# Patient Record
Sex: Male | Born: 1965 | Hispanic: No | Marital: Single | Smoking: Current every day smoker
Health system: Southern US, Community
[De-identification: ages and names within clinical notes are randomized; demographics above are authoritative.]

## PROBLEM LIST (undated history)

## (undated) DIAGNOSIS — Z789 Other specified health status: Secondary | ICD-10-CM

## (undated) HISTORY — PX: APPENDECTOMY: SHX54

---

## 1999-09-20 ENCOUNTER — Emergency Department (HOSPITAL_COMMUNITY): Admission: EM | Admit: 1999-09-20 | Discharge: 1999-09-20 | Payer: Self-pay | Admitting: Emergency Medicine

## 2017-10-21 ENCOUNTER — Emergency Department (HOSPITAL_BASED_OUTPATIENT_CLINIC_OR_DEPARTMENT_OTHER)
Admission: EM | Admit: 2017-10-21 | Discharge: 2017-10-21 | Disposition: A | Discharge status: Home / Self Care | Payer: Self-pay | Attending: Emergency Medicine | Admitting: Emergency Medicine

## 2017-10-21 ENCOUNTER — Other Ambulatory Visit: Payer: Self-pay

## 2017-10-21 ENCOUNTER — Emergency Department (HOSPITAL_BASED_OUTPATIENT_CLINIC_OR_DEPARTMENT_OTHER): Payer: Self-pay

## 2017-10-21 ENCOUNTER — Encounter (HOSPITAL_BASED_OUTPATIENT_CLINIC_OR_DEPARTMENT_OTHER): Payer: Self-pay | Admitting: *Deleted

## 2017-10-21 DIAGNOSIS — M79671 Pain in right foot: Secondary | ICD-10-CM | POA: Insufficient documentation

## 2017-10-21 DIAGNOSIS — F17210 Nicotine dependence, cigarettes, uncomplicated: Secondary | ICD-10-CM | POA: Insufficient documentation

## 2017-10-21 DIAGNOSIS — M545 Low back pain: Secondary | ICD-10-CM | POA: Insufficient documentation

## 2017-10-21 DIAGNOSIS — W11XXXA Fall on and from ladder, initial encounter: Secondary | ICD-10-CM | POA: Insufficient documentation

## 2017-10-21 DIAGNOSIS — M79672 Pain in left foot: Secondary | ICD-10-CM | POA: Insufficient documentation

## 2017-10-21 NOTE — ED Provider Notes (Signed)
[facility] EMERGENCY DEPARTMENT Provider Note   CSN: [id number] Arrival date & time: 10/21/17  1047     History   Chief Complaint Chief Complaint  Patient presents with  . Fall  . Back Pain    HPI Eugene Marquez is a 52 y.o. male.  HPI   Patient is a 52 year old male with no significant past medical history who presents to the emergency department for evaluation of lateral foot pain after fall yesterday.  Patient reports that he was standing on a 12 foot ladder which was accidentally bumped by a coworker.  He fell, landing on straight up on his feet.  Denies falling over or hitting his head.  Denies loss of consciousness.  Reports he now has bilateral heel pain, left worse than right.  States the pain is about a 7/10 in severity and feels sore.  Pain is worsened with weightbearing.  He tried taking some ibuprofen yesterday without improvement.  Is also reporting mild lower back pain which is present only with twisting or bending.  Denies hip or knee pain.  He denies headache, numbness, weakness, visual disturbance, chest pain, shortness of breath, abdominal pain, open wounds.  Is able to able to independently despite pain.  History reviewed. No pertinent past medical history.  There are no active problems to display for this patient.   History reviewed. No pertinent surgical history.      Home Medications    Prior to Admission medications   Not on File    Family History History reviewed. No pertinent family history.  Social History Social History   Tobacco Use  . Smoking status: Current Every Day Smoker  . Smokeless tobacco: Never Used  Substance Use Topics  . Alcohol use: Not on file  . Drug use: Not on file     Allergies   Patient has no known allergies.   Review of Systems Review of Systems  Constitutional: Negative for fever.  Eyes: Negative for visual disturbance.  Respiratory: Negative for shortness of breath.   Cardiovascular:  Negative for chest pain.  Gastrointestinal: Negative for abdominal pain, nausea and vomiting.  Genitourinary: Negative for difficulty urinating.  Musculoskeletal: Positive for arthralgias (bilateral foot pain) and back pain. Negative for neck pain.  Skin: Negative for wound.  Neurological: Negative for weakness, numbness and headaches.  Psychiatric/Behavioral: Negative for agitation.     Physical Exam Updated Vital Signs BP 125/88 (BP Location: Right Arm)   Pulse 80   Resp 18   SpO2 100%   Physical Exam  Constitutional: He is oriented to person, place, and time. He appears well-developed and well-nourished. No distress.  No acute distress, nontoxic-appearing.  HENT:  Head: Normocephalic and atraumatic.  Mouth/Throat: Oropharynx is clear and moist. No oropharyngeal exudate.  Eyes: Pupils are equal, round, and reactive to light. Conjunctivae are normal. Right eye exhibits no discharge. Left eye exhibits no discharge.  Neck: Normal range of motion. Neck supple.  Approximately 5 cm mass noted over the posterior neck.  It is freely mobile.  No erythema, warmth overlying.  It is nontender to palpation.  No midline cervical spine tenderness.  Cardiovascular: Normal rate, regular rhythm and intact distal pulses.  Pulmonary/Chest: Effort normal and breath sounds normal. No respiratory distress. He has no wheezes.  Abdominal: Soft. There is no tenderness.  Musculoskeletal:  No midline T-spine or L-spine tenderness.  No tenderness over the bilateral paraspinal muscles of the thoracic or lumbar spine.  Left and right foot with tenderness over the  heel.  No erythema, warmth, break in skin or ecchymosis noted.  No deformity over the foot.  Full active range of motion of the ankle without pain.  No tenderness over the navicular or base of the fifth metatarsal.  Achilles intact bilaterally.  DP pulses 2+ bilaterally.  Sensation to light touch intact in toes of bilateral feet.  Neurological: He is  alert and oriented to person, place, and time. Coordination normal.  Skin: Skin is warm and dry. Capillary refill takes less than 2 seconds. He is not diaphoretic.  Psychiatric: He has a normal mood and affect. His behavior is normal.  Nursing note and vitals reviewed.    ED Treatments / Results  Labs (all labs ordered are listed, but only abnormal results are displayed) Labs Reviewed - No data to display  EKG None  Radiology Dg Foot Complete Left  Result Date: 10/21/2017 CLINICAL DATA:  Left foot pain. The patient suffered a fall from a ladder today. Initial encounter. EXAM: LEFT FOOT - COMPLETE 3+ VIEW COMPARISON:  None. FINDINGS: There is no evidence of fracture or dislocation. 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 Soft tissues are unremarkable. IMPRESSION: No acute abnormality. Mild moderate first MTP osteoarthritis Electronically Signed   By: Drusilla Kanner M.D.   On: 10/21/2017 13:14   Dg Foot Complete Right  Result Date: 10/21/2017 CLINICAL DATA:  Status post fall from a ladder today with a foot injury. Pain. Initial encounter. EXAM: RIGHT FOOT COMPLETE - 3+ VIEW COMPARISON:  None. FINDINGS: There is no evidence of fracture or dislocation. There is no evidence of arthropathy or other focal bone abnormality. Soft tissues are unremarkable. IMPRESSION: Negative exam. Electronically Signed   By: Drusilla Kanner M.D.   On: 10/21/2017 13:15    Procedures Procedures (including critical care time)  Medications Ordered in ED Medications - No data to display   Initial Impression / Assessment and Plan / ED Course  I have reviewed the triage vital signs and the nursing notes.  Pertinent labs & imaging results that were available during my care of the patient were reviewed by me and considered in my medical decision making (see chart for details).     Patient presents after falling from a 12 foot ladder yesterday on his feet.  Denies hitting his head or loss of  consciousness.  Reporting bilateral foot pain.  Also endorsing some back pain with movement.  No midline T-spine or L-spine tenderness on exam and patient reports back pain only occurs with movement.  He is neurovascularly intact in bilateral lower extremities.  Given no pain on exam, doubt acute vertebral fracture.  No concern for spinal injury.  Do not think that x-rays of the spine are indicated given exam and discussed this with patient who agrees.  X-ray right and left foot without acute fracture or abnormality. Foot is neurovascularly intact. No break in skin. Counseled patient on symptomatic management with NSAIDS, tylenol and RICE protocol. Discussed reasons to return to the Emergency Department. Patient agrees and voices understanding to the above plan and has no complaints prior to discharge.   Final Clinical Impressions(s) / ED Diagnoses   Final diagnoses:  Pain in both feet  Fall from ladder, initial encounter    ED Discharge Orders    None       Kellie Shropshire, Cordelia Poche 10/25/17 1459    Tegeler, Canary Brim, MD 10/29/17 [phone]

## 2017-10-21 NOTE — Discharge Instructions (Addendum)
X-rays are reassuring.  No broken bones.  Please follow-up with a regular doctor to establish care.  I have listed the information to [facility] below.  They are located across the street for [facility] and accept patients who do not have insurance.  You can take Tylenol and ibuprofen for pain.  Return to the emergency department if you have any new or concerning symptoms.

## 2017-10-21 NOTE — ED Triage Notes (Signed)
Pt reports another worker hit his ladder while he was working on the eaves of a home yesterday and he fell approx 12' from ladder. Pt amb to triage with quick steady gait, smiling and laughing during triage, denies hitting head or loc, states he landed on his feet and also has bilateral heel pain.

## 2018-10-20 IMAGING — CR DG FOOT COMPLETE 3+V*R*
3 series · 3 of 3 positions shown · non-contrast
Comparison: None.

CLINICAL DATA: Status post fall from a ladder today with a foot
injury. Pain. Initial encounter.

EXAM:
RIGHT FOOT COMPLETE - 3+ VIEW

[t foot ap right]
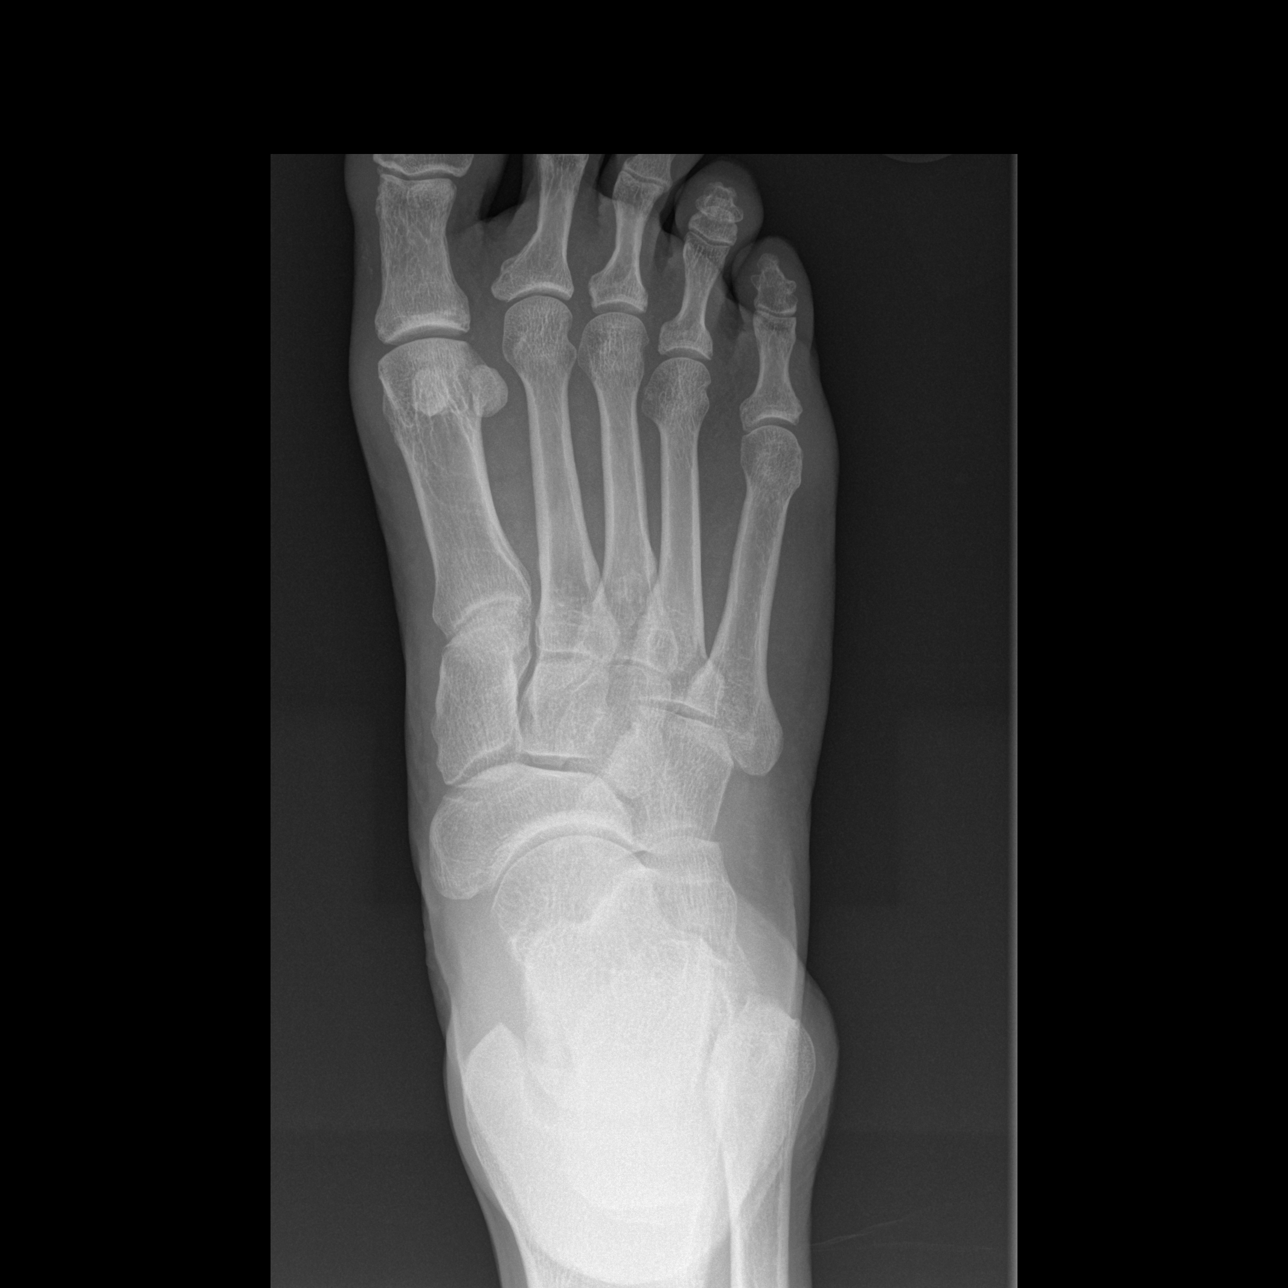

[t foot oblique right]
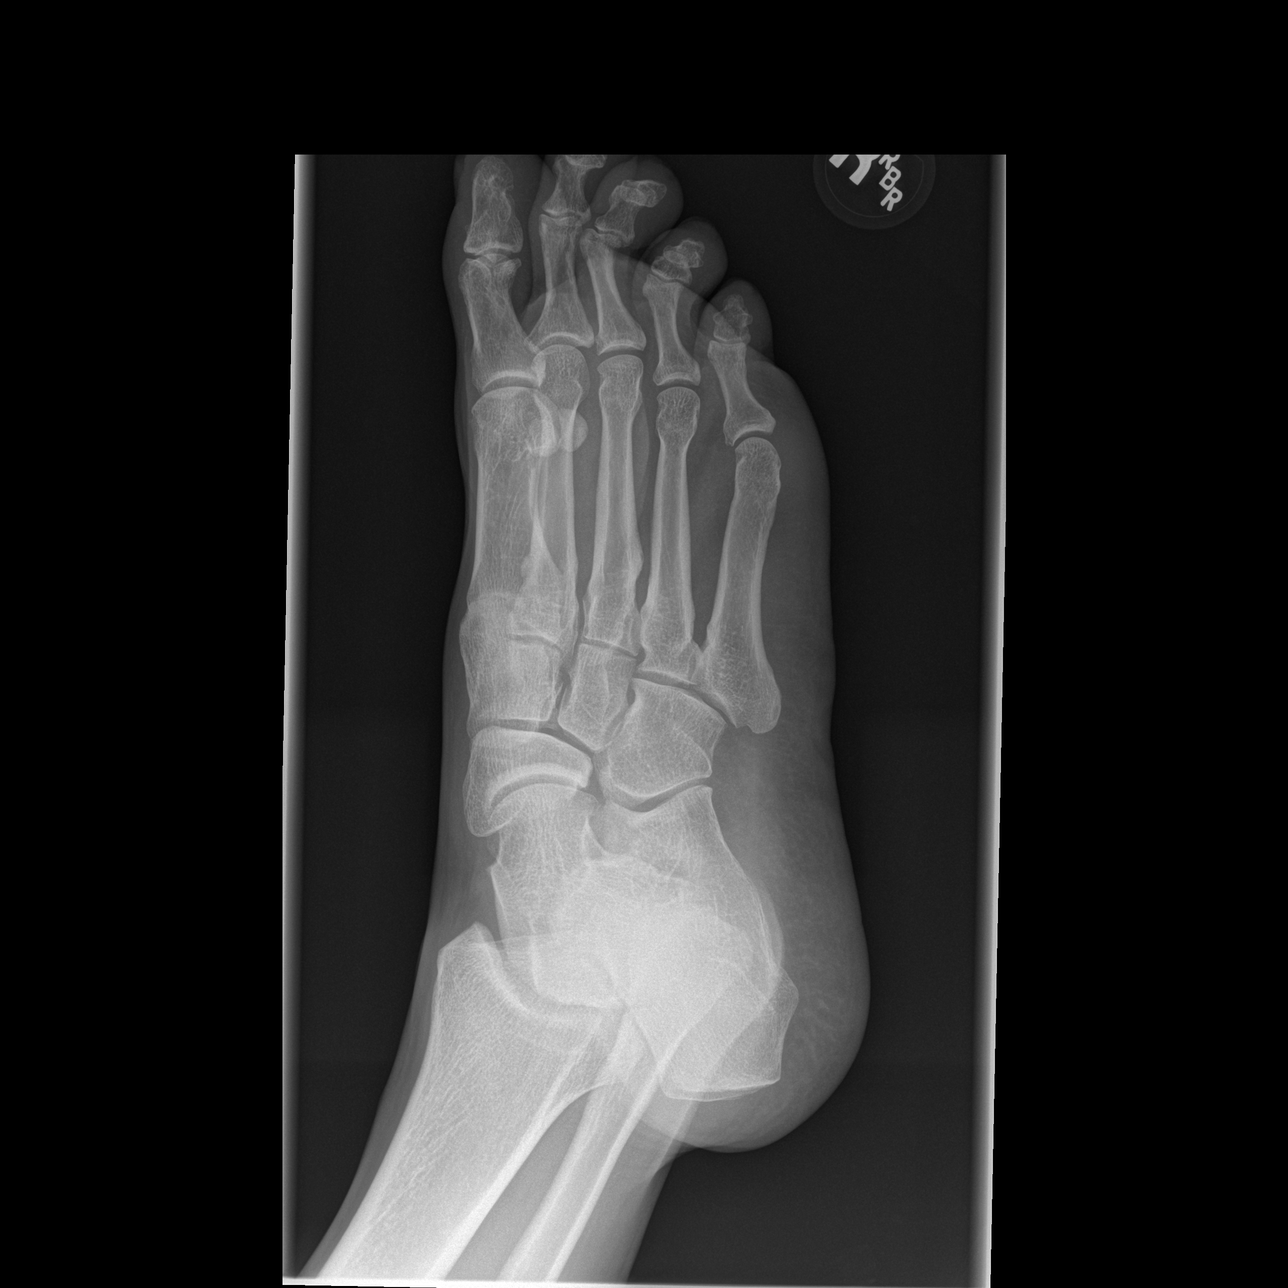

[t foot lat right]
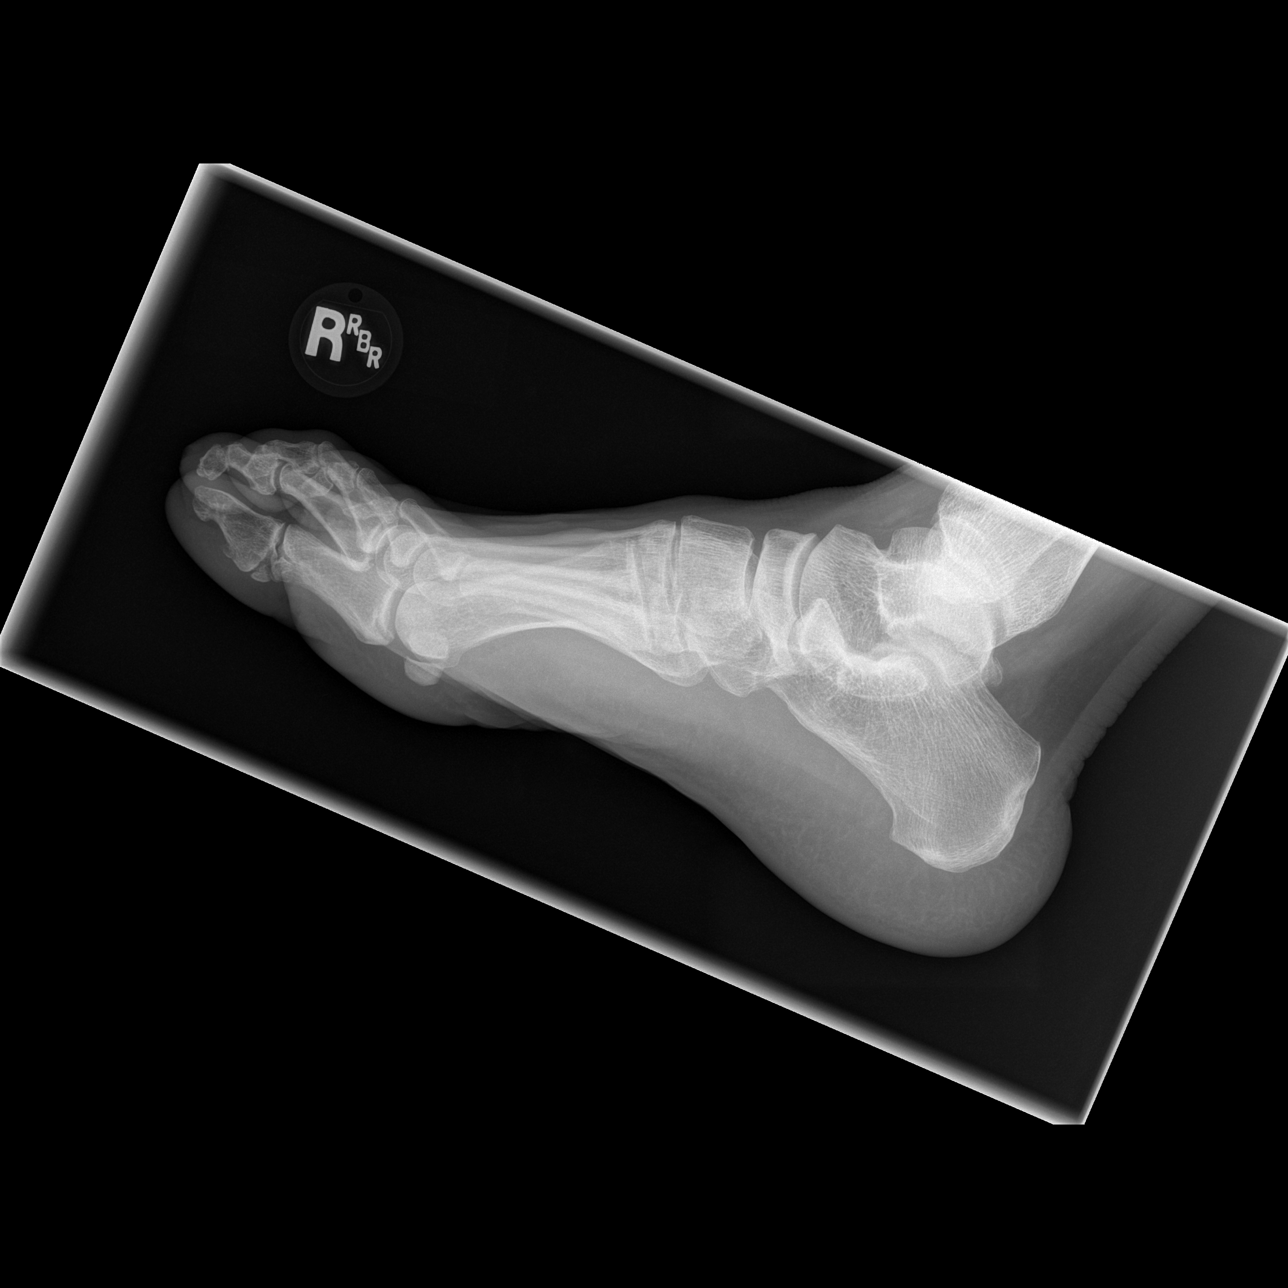

[3 of 3 positions shown; findings below may reference images not displayed]

FINDINGS: There is no evidence of fracture or dislocation. There is no
evidence of arthropathy or other focal bone abnormality. Soft
tissues are unremarkable.
IMPRESSION: Negative exam.

## 2018-10-20 IMAGING — CR DG FOOT COMPLETE 3+V*L*
3 series · 3 of 3 positions shown · non-contrast
Comparison: None.

CLINICAL DATA: Left foot pain. The patient suffered a fall from a
ladder today. Initial encounter.

EXAM:
LEFT FOOT - COMPLETE 3+ VIEW

[t foot ap left]
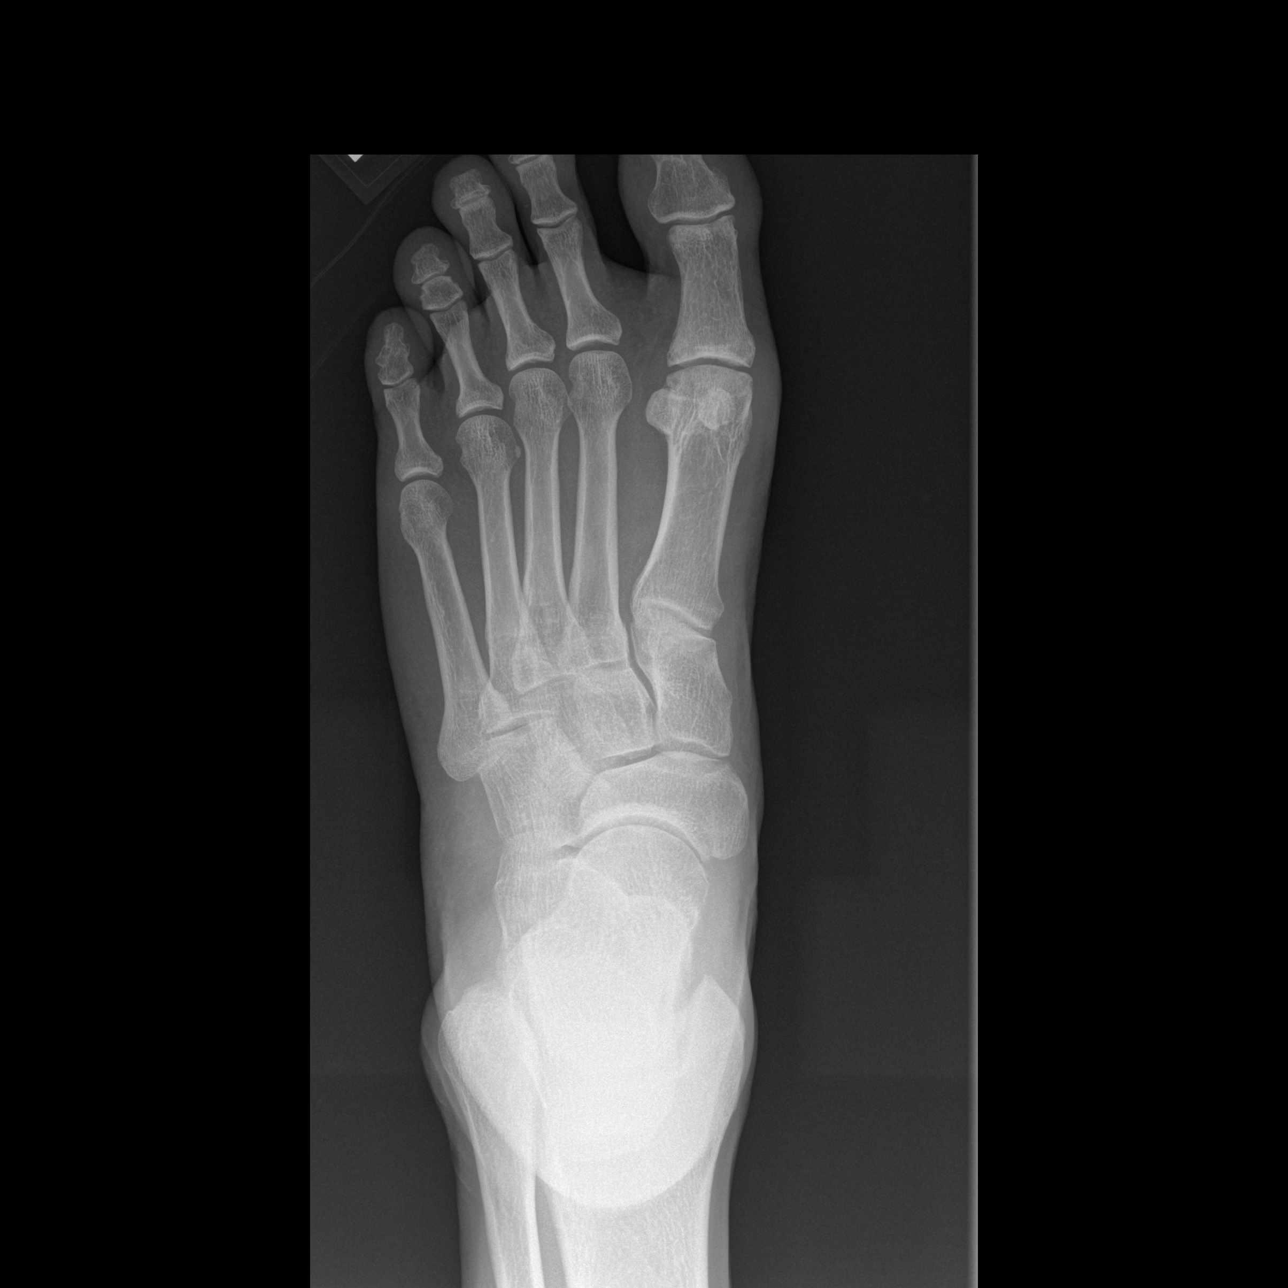

[t foot oblique left]
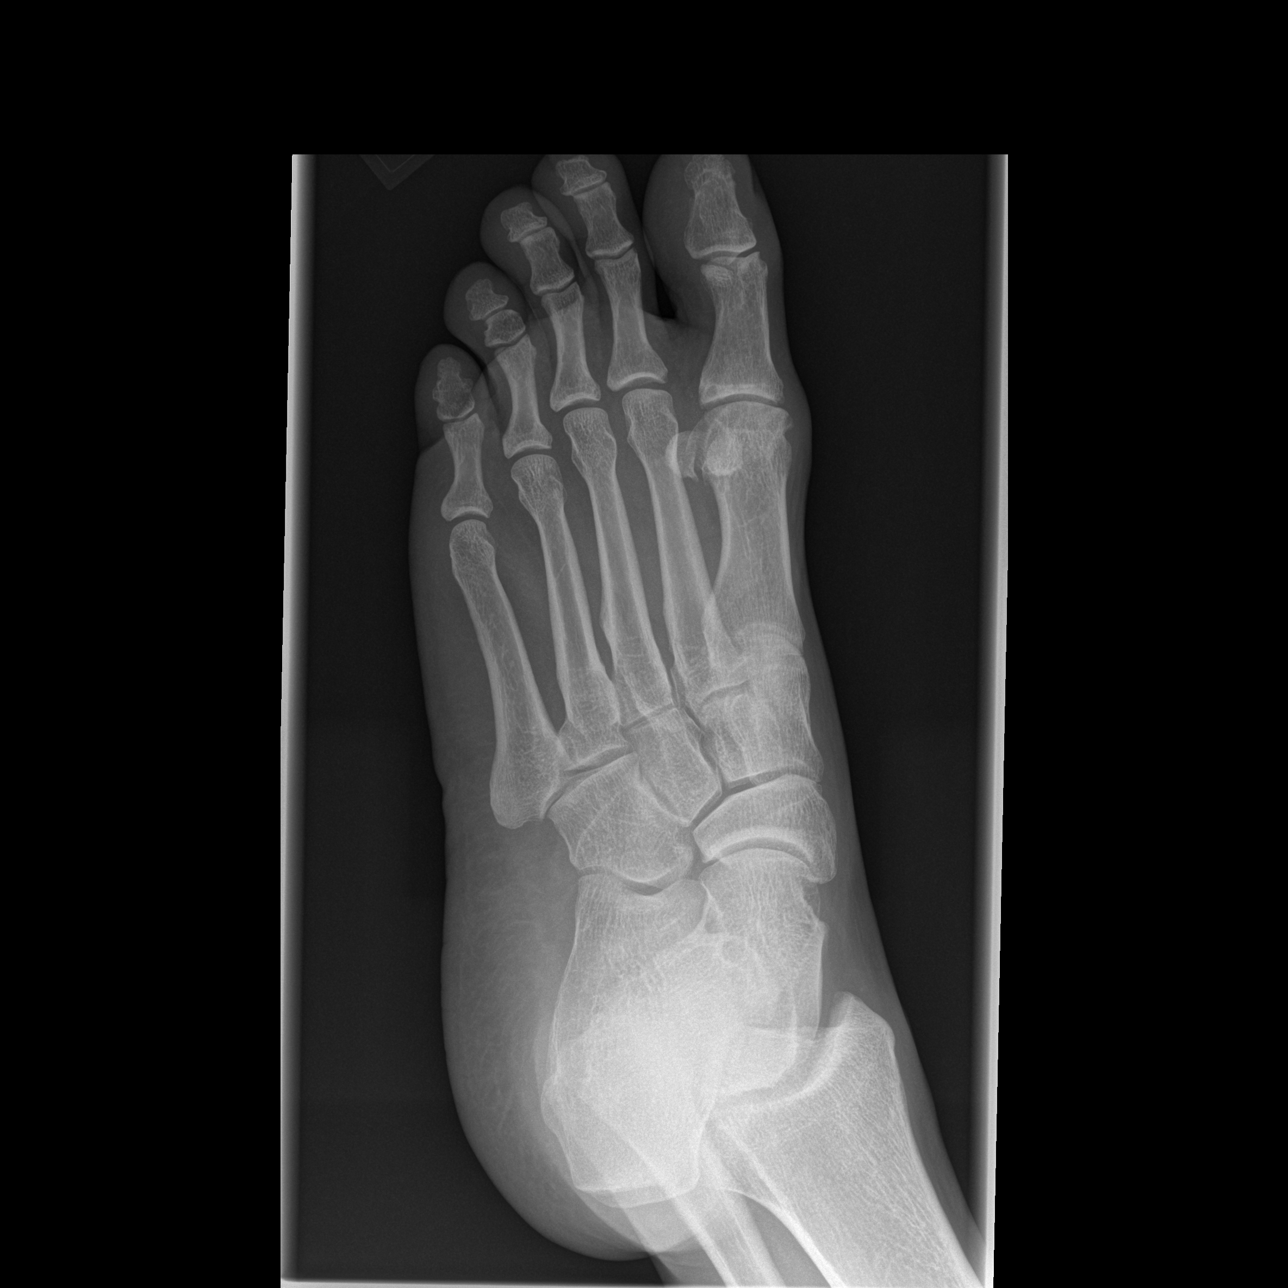

[t foot lat left]
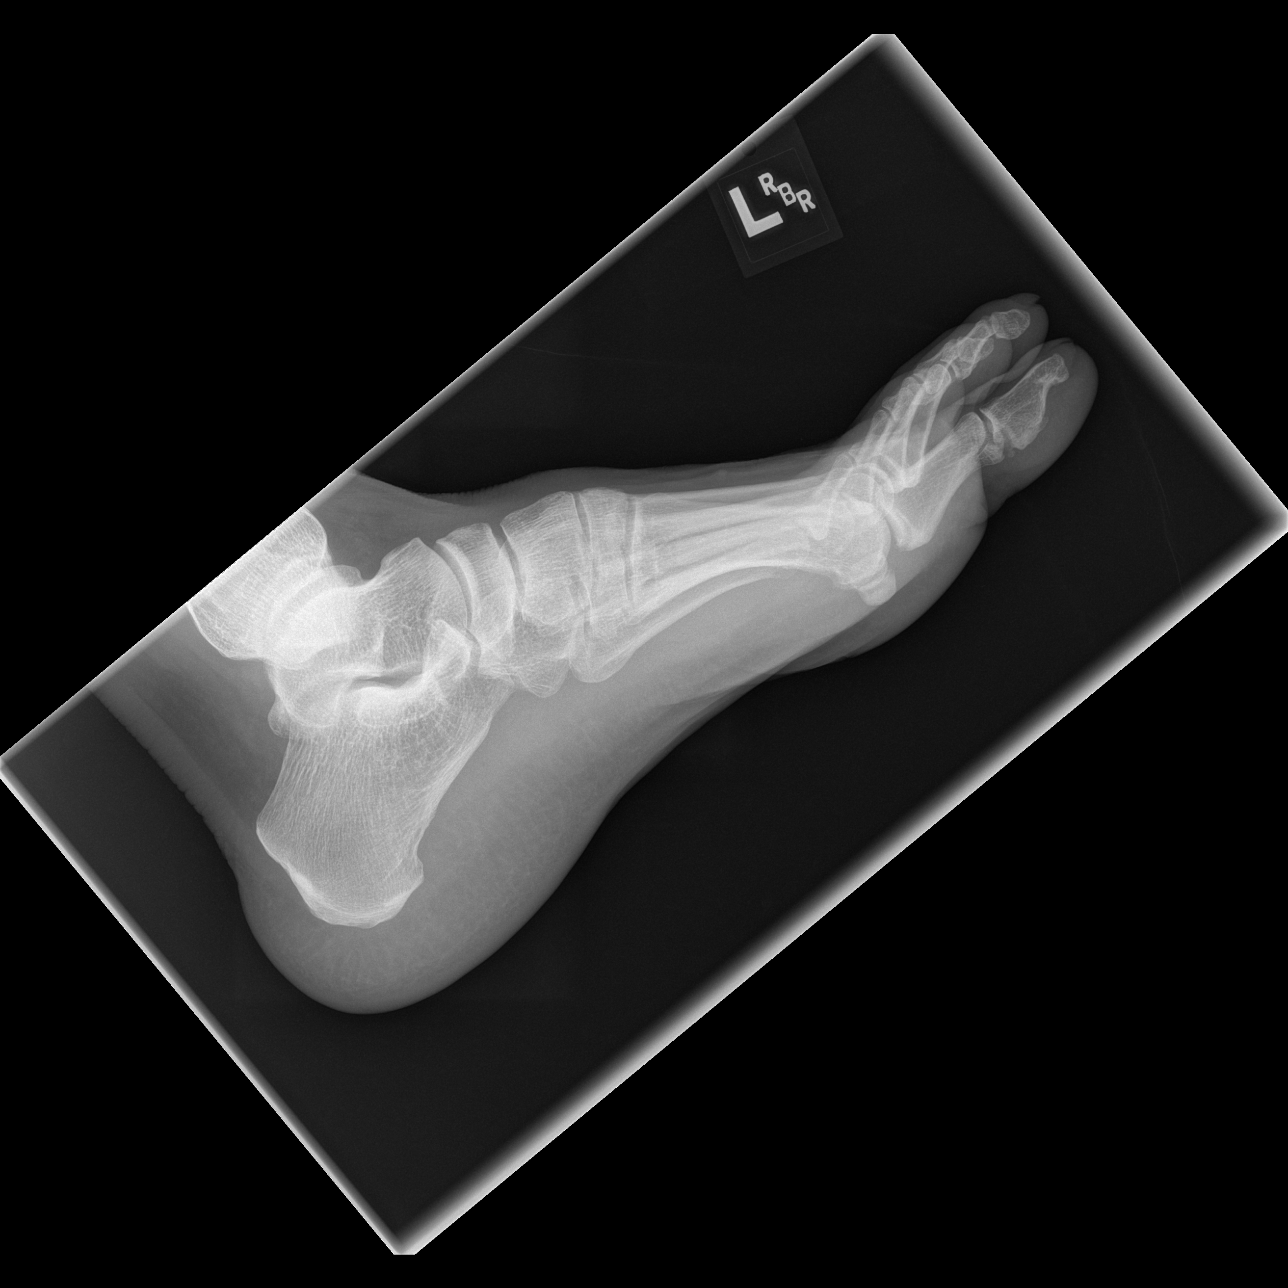

[3 of 3 positions shown; findings below may reference images not displayed]

FINDINGS: There is no evidence of fracture or dislocation. Mild to moderate
first MTP osteoarthritis is noted. Soft tissues are unremarkable.
IMPRESSION: No acute abnormality.

Mild moderate first MTP osteoarthritis

## 2024-01-23 ENCOUNTER — Encounter (HOSPITAL_COMMUNITY): Payer: Self-pay

## 2024-01-23 ENCOUNTER — Ambulatory Visit (HOSPITAL_COMMUNITY)
Admission: EM | Admit: 2024-01-23 | Discharge: 2024-01-23 | Disposition: A | Discharge status: Home / Self Care | Payer: Self-pay | Attending: Orthopedic Surgery | Admitting: Orthopedic Surgery

## 2024-01-23 ENCOUNTER — Other Ambulatory Visit: Payer: Self-pay

## 2024-01-23 ENCOUNTER — Emergency Department (HOSPITAL_COMMUNITY): Payer: Self-pay

## 2024-01-23 ENCOUNTER — Emergency Department (HOSPITAL_COMMUNITY): Payer: Self-pay | Admitting: Anesthesiology

## 2024-01-23 ENCOUNTER — Encounter (HOSPITAL_COMMUNITY)
Admission: EM | Disposition: A | Discharge status: Home / Self Care | Payer: Self-pay | Source: Home / Self Care | Attending: Emergency Medicine

## 2024-01-23 DIAGNOSIS — F17200 Nicotine dependence, unspecified, uncomplicated: Secondary | ICD-10-CM | POA: Insufficient documentation

## 2024-01-23 DIAGNOSIS — S92901B Unspecified fracture of right foot, initial encounter for open fracture: Secondary | ICD-10-CM

## 2024-01-23 DIAGNOSIS — S92511B Displaced fracture of proximal phalanx of right lesser toe(s), initial encounter for open fracture: Secondary | ICD-10-CM | POA: Insufficient documentation

## 2024-01-23 DIAGNOSIS — S91121A Laceration with foreign body of right great toe without damage to nail, initial encounter: Secondary | ICD-10-CM | POA: Insufficient documentation

## 2024-01-23 DIAGNOSIS — S91311A Laceration without foreign body, right foot, initial encounter: Secondary | ICD-10-CM

## 2024-01-23 DIAGNOSIS — W3189XA Contact with other specified machinery, initial encounter: Secondary | ICD-10-CM | POA: Insufficient documentation

## 2024-01-23 HISTORY — PX: INCISION AND DRAINAGE OF WOUND: SHX1803

## 2024-01-23 LAB — COMPREHENSIVE METABOLIC PANEL WITH GFR
ALT: 20 U/L (ref 0–44)
AST: 22 U/L (ref 15–41)
Albumin: 3.6 g/dL (ref 3.5–5.0)
Alkaline Phosphatase: 70 U/L (ref 38–126)
Anion gap: 11 (ref 5–15)
BUN: 10 mg/dL (ref 6–20)
CO2: 22 mmol/L (ref 22–32)
Calcium: 8.5 mg/dL — ABNORMAL LOW (ref 8.9–10.3)
Chloride: 108 mmol/L (ref 98–111)
Creatinine, Ser: 0.86 mg/dL (ref 0.61–1.24)
GFR, Estimated: >60 mL/min (ref >60)
Glucose, Bld: 108 mg/dL — ABNORMAL HIGH (ref 70–99)
Potassium: 3.2 mmol/L — ABNORMAL LOW (ref 3.5–5.1)
Sodium: 141 mmol/L (ref 135–145)
Total Bilirubin: 0.5 mg/dL (ref 0.0–1.2)
Total Protein: 6.4 g/dL — ABNORMAL LOW (ref 6.5–8.1)

## 2024-01-23 LAB — CBC WITH DIFFERENTIAL/PLATELET
Abs Immature Granulocytes: 0.01 K/uL (ref 0.00–0.07)
Basophils Absolute: 0.1 K/uL (ref 0.0–0.1)
Basophils Relative: 1 %
Eosinophils Absolute: 0.1 K/uL (ref 0.0–0.5)
Eosinophils Relative: 3 %
HCT: 41.7 % (ref 39.0–52.0)
Hemoglobin: 14.1 g/dL (ref 13.0–17.0)
Immature Granulocytes: 0 %
Lymphocytes Relative: 40 %
Lymphs Abs: 1.9 K/uL (ref 0.7–4.0)
MCH: 30.4 pg (ref 26.0–34.0)
MCHC: 33.8 g/dL (ref 30.0–36.0)
MCV: 89.9 fL (ref 80.0–100.0)
Monocytes Absolute: 0.4 K/uL (ref 0.1–1.0)
Monocytes Relative: 9 %
Neutro Abs: 2.3 K/uL (ref 1.7–7.7)
Neutrophils Relative %: 47 %
Platelets: 253 K/uL (ref 150–400)
RBC: 4.64 MIL/uL (ref 4.22–5.81)
RDW: 13 % (ref 11.5–15.5)
WBC: 4.9 K/uL (ref 4.0–10.5)
nRBC: 0 % (ref 0.0–0.2)

## 2024-01-23 LAB — ABO/RH: ABO/RH(D): O POS

## 2024-01-23 LAB — TYPE AND SCREEN
ABO/RH(D): O POS
Antibody Screen: NEGATIVE

## 2024-01-23 SURGERY — IRRIGATION AND DEBRIDEMENT WOUND
Anesthesia: General | Site: Foot | Laterality: Right

## 2024-01-23 MED ORDER — ACETAMINOPHEN 325 MG PO TABS: 325.0000 mg | ORAL_TABLET | Freq: Four times a day (QID) | ORAL | Status: DC | PRN

## 2024-01-23 MED ORDER — LIDOCAINE 2% (20 MG/ML) 5 ML SYRINGE
INTRAMUSCULAR | Status: DC | PRN
  Administered 2024-01-23: 60 mg via INTRAVENOUS

## 2024-01-23 MED ORDER — 0.9 % SODIUM CHLORIDE (POUR BTL) OPTIME
TOPICAL | Status: DC | PRN
Start: 2024-01-23 — End: 2024-01-23
  Administered 2024-01-23: 1000 mL

## 2024-01-23 MED ORDER — OXYCODONE HCL 5 MG PO TABS
ORAL_TABLET | ORAL | Status: AC
  Filled 2024-01-23: qty 1

## 2024-01-23 MED ORDER — PROPOFOL 10 MG/ML IV BOLUS
INTRAVENOUS | Status: AC
  Filled 2024-01-23: qty 20

## 2024-01-23 MED ORDER — DOCUSATE SODIUM 100 MG PO CAPS
100.0000 mg | ORAL_CAPSULE | Freq: Two times a day (BID) | ORAL | Status: DC
Start: 2024-01-23 — End: 2024-01-23

## 2024-01-23 MED ORDER — OXYCODONE HCL 5 MG PO TABS
5.0000 mg | ORAL_TABLET | ORAL | Status: DC | PRN
  Administered 2024-01-23: 5 mg via ORAL

## 2024-01-23 MED ORDER — LACTATED RINGERS IV SOLN: INTRAVENOUS | Status: DC | PRN

## 2024-01-23 MED ORDER — ONDANSETRON HCL 4 MG PO TABS
4.0000 mg | ORAL_TABLET | Freq: Four times a day (QID) | ORAL | Status: DC | PRN
Start: 2024-01-23 — End: 2024-01-23

## 2024-01-23 MED ORDER — FENTANYL CITRATE (PF) 250 MCG/5ML IJ SOLN
INTRAMUSCULAR | Status: DC | PRN
  Administered 2024-01-23: 100 ug via INTRAVENOUS

## 2024-01-23 MED ORDER — VASHE WOUND IRRIGATION OPTIME
TOPICAL | Status: DC | PRN
  Administered 2024-01-23: 34 [oz_av]

## 2024-01-23 MED ORDER — SUGAMMADEX SODIUM 200 MG/2ML IV SOLN
INTRAVENOUS | Status: DC | PRN
  Administered 2024-01-23: 200 mg via INTRAVENOUS

## 2024-01-23 MED ORDER — ROCURONIUM BROMIDE 10 MG/ML (PF) SYRINGE
PREFILLED_SYRINGE | INTRAVENOUS | Status: DC | PRN
  Administered 2024-01-23: 30 mg via INTRAVENOUS

## 2024-01-23 MED ORDER — DEXAMETHASONE SODIUM PHOSPHATE 10 MG/ML IJ SOLN
INTRAMUSCULAR | Status: DC | PRN
  Administered 2024-01-23: 10 mg via INTRAVENOUS

## 2024-01-23 MED ORDER — PHENYLEPHRINE 80 MCG/ML (10ML) SYRINGE FOR IV PUSH (FOR BLOOD PRESSURE SUPPORT)
PREFILLED_SYRINGE | INTRAVENOUS | Status: DC | PRN
  Administered 2024-01-23 (×3): 160 ug via INTRAVENOUS

## 2024-01-23 MED ORDER — METHOCARBAMOL 500 MG PO TABS
500.0000 mg | ORAL_TABLET | Freq: Four times a day (QID) | ORAL | Status: DC | PRN
Start: 2024-01-23 — End: 2024-01-23
  Administered 2024-01-23: 500 mg via ORAL

## 2024-01-23 MED ORDER — ACETAMINOPHEN 500 MG PO TABS
ORAL_TABLET | ORAL | Status: AC
  Filled 2024-01-23: qty 2

## 2024-01-23 MED ORDER — FENTANYL CITRATE (PF) 250 MCG/5ML IJ SOLN
INTRAMUSCULAR | Status: AC
  Filled 2024-01-23: qty 5

## 2024-01-23 MED ORDER — PROPOFOL 10 MG/ML IV BOLUS
INTRAVENOUS | Status: DC | PRN
  Administered 2024-01-23: 200 mg via INTRAVENOUS

## 2024-01-23 MED ORDER — ONDANSETRON HCL 4 MG/2ML IJ SOLN
INTRAMUSCULAR | Status: DC | PRN
  Administered 2024-01-23: 4 mg via INTRAVENOUS

## 2024-01-23 MED ORDER — OXYCODONE HCL 5 MG PO TABS: 5.0000 mg | ORAL_TABLET | ORAL | 0 refills | Status: AC | PRN

## 2024-01-23 MED ORDER — HYDROMORPHONE HCL 1 MG/ML IJ SOLN: 0.5000 mg | INTRAMUSCULAR | Status: DC | PRN

## 2024-01-23 MED ORDER — ACETAMINOPHEN 500 MG PO TABS
1000.0000 mg | ORAL_TABLET | Freq: Four times a day (QID) | ORAL | Status: DC
Start: 2024-01-23 — End: 2024-01-23
  Administered 2024-01-23: 1000 mg via ORAL

## 2024-01-23 MED ORDER — TETANUS-DIPHTH-ACELL PERTUSSIS 5-2.5-18.5 LF-MCG/0.5 IM SUSY: 0.5000 mL | PREFILLED_SYRINGE | Freq: Once | INTRAMUSCULAR | Status: DC

## 2024-01-23 MED ORDER — METHOCARBAMOL 1000 MG/10ML IJ SOLN
500.0000 mg | Freq: Four times a day (QID) | INTRAMUSCULAR | Status: DC | PRN
Start: 2024-01-23 — End: 2024-01-23

## 2024-01-23 MED ORDER — FENTANYL CITRATE (PF) 100 MCG/2ML IJ SOLN: 25.0000 ug | INTRAMUSCULAR | Status: DC | PRN

## 2024-01-23 MED ORDER — CEFAZOLIN SODIUM-DEXTROSE 2-4 GM/100ML-% IV SOLN: 2.0000 g | Freq: Once | INTRAVENOUS | Status: DC

## 2024-01-23 MED ORDER — AMISULPRIDE (ANTIEMETIC) 5 MG/2ML IV SOLN: 10.0000 mg | Freq: Once | INTRAVENOUS | Status: DC | PRN

## 2024-01-23 MED ORDER — LACTATED RINGERS IV SOLN: INTRAVENOUS | Status: DC

## 2024-01-23 MED ORDER — ONDANSETRON HCL 4 MG/2ML IJ SOLN
4.0000 mg | Freq: Four times a day (QID) | INTRAMUSCULAR | Status: DC | PRN
Start: 2024-01-23 — End: 2024-01-23

## 2024-01-23 MED ORDER — CHLORHEXIDINE GLUCONATE 0.12 % MT SOLN
15.0000 mL | Freq: Once | OROMUCOSAL | Status: AC
  Administered 2024-01-23: 15 mL via OROMUCOSAL

## 2024-01-23 MED ORDER — SODIUM CHLORIDE 0.9 % IV SOLN: INTRAVENOUS | Status: DC | PRN

## 2024-01-23 MED ORDER — CEFAZOLIN SODIUM-DEXTROSE 2-4 GM/100ML-% IV SOLN
2.0000 g | Freq: Once | INTRAVENOUS | Status: AC
  Administered 2024-01-23: 2 g via INTRAVENOUS
  Filled 2024-01-23: qty 100

## 2024-01-23 MED ORDER — PHENYLEPHRINE HCL-NACL 20-0.9 MG/250ML-% IV SOLN
INTRAVENOUS | Status: DC | PRN
  Administered 2024-01-23: 50 ug/min via INTRAVENOUS

## 2024-01-23 MED ORDER — MIDAZOLAM HCL 2 MG/2ML IJ SOLN
INTRAMUSCULAR | Status: DC | PRN
  Administered 2024-01-23: 2 mg via INTRAVENOUS

## 2024-01-23 MED ORDER — CHLORHEXIDINE GLUCONATE 0.12 % MT SOLN
OROMUCOSAL | Status: AC
Start: 2024-01-23 — End: 2024-01-23
  Filled 2024-01-23: qty 15

## 2024-01-23 MED ORDER — ORAL CARE MOUTH RINSE: 15.0000 mL | Freq: Once | OROMUCOSAL | Status: AC

## 2024-01-23 MED ORDER — METHOCARBAMOL 500 MG PO TABS
ORAL_TABLET | ORAL | Status: AC
  Filled 2024-01-23: qty 1

## 2024-01-23 MED ORDER — OXYCODONE HCL 5 MG PO TABS: 10.0000 mg | ORAL_TABLET | ORAL | Status: DC | PRN

## 2024-01-23 MED ORDER — MIDAZOLAM HCL 2 MG/2ML IJ SOLN
INTRAMUSCULAR | Status: AC
  Filled 2024-01-23: qty 2

## 2024-01-23 MED ORDER — SUCCINYLCHOLINE CHLORIDE 200 MG/10ML IV SOSY
PREFILLED_SYRINGE | INTRAVENOUS | Status: DC | PRN
  Administered 2024-01-23: 100 mg via INTRAVENOUS

## 2024-01-23 MED ORDER — CEPHALEXIN 500 MG PO CAPS: 500.0000 mg | ORAL_CAPSULE | Freq: Four times a day (QID) | ORAL | 0 refills | Status: DC

## 2024-01-23 SURGICAL SUPPLY — 29 items
BLADE SURG 10 STRL SS (BLADE) ×2 IMPLANT
BNDG COMPR ESMARK 4X3 LF (GAUZE/BANDAGES/DRESSINGS) ×2 IMPLANT
BNDG ELASTIC 4INX 5YD STR LF (GAUZE/BANDAGES/DRESSINGS) IMPLANT
BNDG ELASTIC 6INX 5YD STR LF (GAUZE/BANDAGES/DRESSINGS) IMPLANT
BNDG GAUZE DERMACEA FLUFF 4 (GAUZE/BANDAGES/DRESSINGS) ×2 IMPLANT
CLEANSER WND VASHE INSTL 34OZ (WOUND CARE) IMPLANT
COVER SURGICAL LIGHT HANDLE (MISCELLANEOUS) ×2 IMPLANT
DRSG ADAPTIC 3X8 NADH LF (GAUZE/BANDAGES/DRESSINGS) ×2 IMPLANT
ELECTRODE REM PT RTRN 9FT ADLT (ELECTROSURGICAL) ×2 IMPLANT
GAUZE SPONGE 4X4 12PLY STRL (GAUZE/BANDAGES/DRESSINGS) ×2 IMPLANT
GLOVE BIO SURGEON STRL SZ7 (GLOVE) ×2 IMPLANT
GLOVE BIO SURGEON STRL SZ7.5 (GLOVE) ×2 IMPLANT
GLOVE BIOGEL PI IND STRL 8 (GLOVE) ×2 IMPLANT
GOWN STRL REUS W/ TWL LRG LVL3 (GOWN DISPOSABLE) ×6 IMPLANT
KIT BASIN OR (CUSTOM PROCEDURE TRAY) ×2 IMPLANT
KIT TURNOVER KIT B (KITS) ×2 IMPLANT
MANIFOLD NEPTUNE II (INSTRUMENTS) ×2 IMPLANT
NS IRRIG 1000ML POUR BTL (IV SOLUTION) ×2 IMPLANT
PACK ORTHO EXTREMITY (CUSTOM PROCEDURE TRAY) ×2 IMPLANT
PAD ARMBOARD POSITIONER FOAM (MISCELLANEOUS) ×4 IMPLANT
PADDING CAST ABS COTTON 4X4 ST (CAST SUPPLIES) IMPLANT
PADDING CAST ABS COTTON 6X4 NS (CAST SUPPLIES) IMPLANT
SPLINT FIBERGLASS 4X30 (CAST SUPPLIES) IMPLANT
SPONGE T-LAP 18X18 ~~LOC~~+RFID (SPONGE) ×4 IMPLANT
SUT ETHILON 2 0 PSLX (SUTURE) IMPLANT
TOWEL GREEN STERILE (TOWEL DISPOSABLE) ×2 IMPLANT
TUBE CONNECTING 12X1/4 (SUCTIONS) ×2 IMPLANT
WATER STERILE IRR 1000ML POUR (IV SOLUTION) ×2 IMPLANT
YANKAUER SUCT BULB TIP NO VENT (SUCTIONS) ×2 IMPLANT

## 2024-01-23 NOTE — Op Note (Signed)
 Procedure(s): IRRIGATION AND DEBRIDEMENT; WOUND CLOSURE Procedure Note  Ott Zimmerle male 58 y.o. 01/23/2024  Preoperative diagnosis: Right foot open fracture with near amputation  Postoperative diagnosis: Same  Procedure(s) and Anesthesia Type:    * IRRIGATION AND DEBRIDEMENT; WOUND CLOSURE - General  Surgeons and Role:    DEWAINE Dozier Soulier, MD - Primary   Indications:  58 y.o. male s/p lawnmower injury to the right foot with near amputation.  Indicated for surgical treatment to irrigate and debride the wound to prevent infection  Surgeon: Josefa LELON Dozier   Assistants: Jeoffrey Northern PA-C Amber was present and scrubbed throughout the procedure and was essential in positioning, retraction, exposure, and closure)  Anesthesia: General endotracheal anesthesia   Procedure Detail  IRRIGATION AND DEBRIDEMENT; WOUND CLOSURE  Findings: See below  Estimated Blood Loss:  Minimal         Drains: none  Blood Given: none         Specimens: none        Complications:  * No complications entered in OR log *         Disposition: PACU - hemodynamically stable.         Condition: stable    Procedure:  DESCRIPTION OF PROCEDURE: The patient was identified in preoperative  holding area where I personally marked the operative site after  verifying site, side, and procedure with the patient. The patient was taken back  to the operating room where general anesthesia was induced without  complication and was kept in the supine position. The right foot was sterilely prepped and draped with Betadine prep and solution. The wound was copiously irrigated first with 1 L normal saline.  There were several bone fragments and a few small contaminants with grass and dirt which were removed.  The MTP joint of the 1st and 2nd toe were completely exposed with the injury.  The proximal phalanx of the second toe was severely comminuted and multiple fracture fragments were devoid of any soft  tissue attachment and were removed.  The neurovascular bundles did appear to be intact and the volar tissues were intact.  The toes did feel warm and were felt to be perfused. The wound was then copiously irrigated with Vashe solution and subsequently closed with 2-0 nylon suture loosely.  A sterile dressing was applied as well as a posterior splint.  The patient did receive preoperative Ancef . The patient was allowed to awaken from anesthesia transferred to the stretcher and taken to the recovery room in stable condition.  Postoperative plan: He will be discharged home today on continued antibiotics and will be contacted tomorrow by our office with a plan for return trip to the operating room in the next few days with Dr. Elsa for definitive management which may involve repair versus revision amputation.

## 2024-01-23 NOTE — ED Provider Notes (Signed)
 [place] EMERGENCY DEPARTMENT AT [facility] Provider Note   CSN: [id number] Arrival date & time: 01/23/24  1400     Patient presents with: foot/toe lacs   Eugene Marquez is a 58 y.o. male.   58 year old male with prior medical history as detailed below presents for evaluation.  Patient was using his push lawnmower while wearing crocs.  He his foot slipped.  His right foot went into the lawnmower blade.  He has a complex laceration of his right foot.  Last p.o. intake was at 9:30 AM today.  The history is provided by the patient, the EMS personnel and medical records.       Prior to Admission medications   Not on File    Allergies: Patient has no known allergies.    Review of Systems  All other systems reviewed and are negative.   Updated Vital Signs Temp 97.8 F (36.6 C) (Oral)   Physical Exam Vitals and nursing note reviewed.  Constitutional:      General: He is not in acute distress.    Appearance: Normal appearance. He is well-developed.  HENT:     Head: Normocephalic and atraumatic.  Eyes:     Conjunctiva/sclera: Conjunctivae normal.     Pupils: Pupils are equal, round, and reactive to light.  Cardiovascular:     Rate and Rhythm: Normal rate and regular rhythm.     Heart sounds: Normal heart sounds.  Pulmonary:     Effort: Pulmonary effort is normal. No respiratory distress.     Breath sounds: Normal breath sounds.  Abdominal:     General: There is no distension.     Palpations: Abdomen is soft.     Tenderness: There is no abdominal tenderness.  Musculoskeletal:        General: No deformity.     Cervical back: Normal range of motion and neck supple.     Comments: Deep, complex laceration to the distal right foot.  Wound contaminated with grass and dirt.  See images below.  Skin:    General: Skin is warm and dry.  Neurological:     General: No focal deficit present.     Mental Status: He is alert and oriented to person, place, and  time.          (all labs ordered are listed, but only abnormal results are displayed) Labs Reviewed - No data to display  EKG: None  Radiology: No results found.   Procedures   Medications Ordered in the ED  ceFAZolin  (ANCEF ) IVPB 2g/100 mL premix (has no administration in time range)                                    Medical Decision Making Patient with laceration to the right foot from a lawnmower blade.  Wound is contaminated and complex and involves tendons and bone.  Imaging ordered.  Antibiotics administered.  Patient reports that his tetanus is up-to-date however I cannot find documentation of this.  Will readminister Tdap.  Last p.o. intake at 9:30 AM.  Dr. Dozier, orthopedics, is aware of case.  Patient will go to OR for washout and repair.    Amount and/or Complexity of Data Reviewed Labs: ordered. Radiology: ordered.  Risk Prescription drug management. Decision regarding hospitalization.   CRITICAL CARE Performed by: Maude JAYSON Galloway   Total critical care time: 30 minutes  Critical care time was exclusive of separately  billable procedures and treating other patients.  Critical care was necessary to treat or prevent imminent or life-threatening deterioration.  Critical care was time spent personally by me on the following activities: development of treatment plan with patient and/or surrogate as well as nursing, discussions with consultants, evaluation of patient's response to treatment, examination of patient, obtaining history from patient or surrogate, ordering and performing treatments and interventions, ordering and review of laboratory studies, ordering and review of radiographic studies, pulse oximetry and re-evaluation of patient's condition.      Final diagnoses:  Foot laceration, right, initial encounter    ED Discharge Orders     None          Laurice Maude BROCKS, MD 01/23/24 [phone]

## 2024-01-23 NOTE — H&P (Signed)
 Eugene Marquez is an 58 y.o. male.   Chief Complaint: R foot injury HPI: The patient was mowing his lawn in crocs today and his foot slipped underneath the mower.  He was found to have open injury to the forefoot in the emergency department.  History reviewed. No pertinent past medical history.  History reviewed. No pertinent surgical history.  History reviewed. No pertinent family history. Social History:  reports that he has been smoking. He has never used smokeless tobacco. No history on file for alcohol use and drug use.  Allergies: No Known Allergies  No medications prior to admission.    Results for orders placed or performed during the hospital encounter of 01/23/24 (from the past 48 hours)  Type and screen [facility]     Status: None (Preliminary result)   Collection Time: 01/23/24  3:10 PM  Result Value Ref Range   ABO/RH(D) PENDING    Antibody Screen PENDING    Sample Expiration      01/26/2024,2359 Performed at [facility], [address], [place], [place] [address]   ABO/Rh     Status: None (Preliminary result)   Collection Time: 01/23/24  3:15 PM  Result Value Ref Range   ABO/RH(D) PENDING    DG MINI C-ARM IMAGE ONLY Result Date: 01/23/2024 There is no interpretation for this exam.  This order is for images obtained during a surgical procedure.  Please See Surgeries Tab for more information regarding the procedure.   DG Foot Complete Right Result Date: 01/23/2024 CLINICAL DATA:  Laceration, fracture EXAM: RIGHT FOOT COMPLETE - 3+ VIEW COMPARISON:  Oct 21, 2017 FINDINGS: Laceration along the medial aspect of the forefoot adjacent to the first MTP joint. Osseous fragment at level of the first MTP joint possibly related to fracture of the base of the first digit proximal phalanx and comminuted intra-articular fracture involving the base of the second digit proximal phalanx. Likely comminuted, nondisplaced first digit distal phalanx fracture.  IMPRESSION: 1. Comminuted, intra-articular fracture of the base of the second digit proximal phalanx. 2. Osseous fragment at the first MTP joint possibly related to fracture of the base of the first digit proximal phalanx. 3. Likely nondisplaced comminuted first digit distal phalanx fracture. Electronically Signed   By: Michaeline Blanch M.D.   On: 01/23/2024 15:06    Review of Systems  All other systems reviewed and are negative.   Blood pressure 138/84, pulse (!) 57, temperature 97.8 F (36.6 C), temperature source Oral, resp. rate 18, SpO2 95%. Physical Exam Constitutional:      Appearance: He is well-developed.  HENT:     Head: Atraumatic.  Eyes:     Extraocular Movements: Extraocular movements intact.  Cardiovascular:     Pulses: Normal pulses.  Pulmonary:     Effort: Pulmonary effort is normal.  Musculoskeletal:     Comments: R foot with bloody bandage  Skin:    General: Skin is warm and dry.  Neurological:     Mental Status: He is alert and oriented to person, place, and time.  Psychiatric:        Mood and Affect: Mood normal.      Assessment/Plan Right foot open fractures from lawnmower injury He will be taken to the operating room today for irrigation debridement and closure of wounds with plan for return to the operating room later this week with Dr. Elsa, foot and ankle specialist to definitively try and address the injuries more completely. The plan was discussed with  the patient and family and they agree.  He has already had IV antibiotics and will be discharged home with oral antibiotics as well.  Eugene LELON Herring, MD 01/23/2024, 3:40 PM

## 2024-01-23 NOTE — Transfer of Care (Signed)
 Immediate Anesthesia Transfer of Care Note  Patient: Eugene Marquez  Procedure(s) Performed: IRRIGATION AND DEBRIDEMENT; WOUND CLOSURE (Right: Foot)  Patient Location: PACU  Anesthesia Type:General  Level of Consciousness: awake, alert , oriented, and patient cooperative  Airway & Oxygen Therapy: Patient Spontanous Breathing and Patient connected to face mask oxygen  Post-op Assessment: Report given to RN, Post -op Vital signs reviewed and stable, Patient moving all extremities, and Patient moving all extremities X 4  Post vital signs: Reviewed and stable  Last Vitals:  Vitals Value Taken Time  BP 139/73 01/23/24 16:51  Temp 36.4 C 01/23/24 16:50  Pulse 72 01/23/24 16:56  Resp 17 01/23/24 16:56  SpO2 96 % 01/23/24 16:56  Vitals shown include unfiled device data.  Last Pain:  Vitals:   01/23/24 1650  TempSrc:   PainSc: 0-No pain         Complications: No notable events documented.

## 2024-01-23 NOTE — Progress Notes (Signed)
 Orthopedic Tech Progress Note Patient Details:  Eugene Marquez Oct 20, 1965 [id number]  Ortho Devices Type of Ortho Device: Crutches Ortho Device/Splint Location: at bedside Ortho Device/Splint Interventions: Ordered, Application, Adjustment   Post Interventions Instructions Provided: Poper ambulation with device, Care of device, Adjustment of device  Eugene Marquez 01/23/2024, 6:09 PM

## 2024-01-23 NOTE — Anesthesia Preprocedure Evaluation (Signed)
 Anesthesia Evaluation  Patient identified by MRN, date of birth, ID band Patient awake    Reviewed: Allergy & Precautions, NPO status , Patient's Chart, lab work & pertinent test results  Airway Mallampati: II  TM Distance: >3 FB Neck ROM: Full    Dental  (+) Dental Advisory Given   Pulmonary Current Smoker   breath sounds clear to auscultation       Cardiovascular negative cardio ROS  Rhythm:Regular Rate:Normal     Neuro/Psych negative neurological ROS     GI/Hepatic negative GI ROS, Neg liver ROS,,,  Endo/Other  negative endocrine ROS    Renal/GU negative Renal ROS     Musculoskeletal   Abdominal   Peds  Hematology negative hematology ROS (+)   Anesthesia Other Findings   Reproductive/Obstetrics                              Anesthesia Physical Anesthesia Plan  ASA: 2 and emergent  Anesthesia Plan: General   Post-op Pain Management: Ofirmev  IV (intra-op)* and Precedex   Induction: Intravenous and Rapid sequence  PONV Risk Score and Plan: 1 and Dexamethasone , Ondansetron  and Treatment may vary due to age or medical condition  Airway Management Planned: Oral ETT  Additional Equipment:   Intra-op Plan:   Post-operative Plan: Extubation in OR  Informed Consent: I have reviewed the patients History and Physical, chart, labs and discussed the procedure including the risks, benefits and alternatives for the proposed anesthesia with the patient or authorized representative who has indicated his/her understanding and acceptance.     Dental advisory given  Plan Discussed with: CRNA  Anesthesia Plan Comments:         Anesthesia Quick Evaluation

## 2024-01-23 NOTE — Discharge Instructions (Signed)
 Do not bear wear on the right foot Keep the splint dry and clean Our office will reach out to you Monday 01/24/24 regarding next steps   Please call [phone] during normal business hours or [phone] after hours for any problems. Including the following:  - excessive redness of the incisions - drainage for more than 4 days - fever of more than 101.5 F  *Please note that pain medications will not be refilled after hours or on weekends.

## 2024-01-23 NOTE — ED Triage Notes (Signed)
 Pt BIB [employer], pt had lawn mower cut into right foot, pt wearing crocs. Laceration  above right big toe and second toe. Bleeding controlled. 100 mcg fentanyl  PTA  148/80 HR 68 96% room air

## 2024-01-23 NOTE — Anesthesia Postprocedure Evaluation (Signed)
 Anesthesia Post Note  Patient: Eugene Marquez  Procedure(s) Performed: IRRIGATION AND DEBRIDEMENT; WOUND CLOSURE (Right: Foot)     Patient location during evaluation: PACU Anesthesia Type: General Level of consciousness: awake and alert Pain management: pain level controlled Vital Signs Assessment: post-procedure vital signs reviewed and stable Respiratory status: spontaneous breathing, nonlabored ventilation, respiratory function stable and patient connected to nasal cannula oxygen Cardiovascular status: blood pressure returned to baseline and stable Postop Assessment: no apparent nausea or vomiting Anesthetic complications: no   No notable events documented.  Last Vitals:  Vitals:   01/23/24 1715 01/23/24 1730  BP: (!) 151/100 (!) 144/94  Pulse: 67 61  Resp: 17 18  Temp:    SpO2: 95% 97%    Last Pain:  Vitals:   01/23/24 1730  TempSrc:   PainSc: 4                  Epifanio Lamar BRAVO

## 2024-01-23 NOTE — Anesthesia Procedure Notes (Signed)
 Procedure Name: Intubation Date/Time: 01/23/2024 4:05 PM  Performed by: Arvell Edsel HERO, CRNAPre-anesthesia Checklist: Patient identified, Emergency Drugs available, Suction available, Timeout performed and Patient being monitored Patient Re-evaluated:Patient Re-evaluated prior to induction Oxygen Delivery Method: Circle system utilized Preoxygenation: Pre-oxygenation with 100% oxygen Induction Type: IV induction, Rapid sequence and Cricoid Pressure applied Laryngoscope Size: Mac and 3 Grade View: Grade I Tube type: Oral Tube size: 7.0 mm Number of attempts: 1 Airway Equipment and Method: Patient positioned with wedge pillow Placement Confirmation: ETT inserted through vocal cords under direct vision, positive ETCO2, CO2 detector and breath sounds checked- equal and bilateral Secured at: 22 cm Tube secured with: Tape

## 2024-01-24 ENCOUNTER — Encounter (HOSPITAL_COMMUNITY): Payer: Self-pay | Admitting: Orthopedic Surgery

## 2024-02-02 ENCOUNTER — Other Ambulatory Visit: Payer: Self-pay | Admitting: Orthopaedic Surgery

## 2024-02-02 ENCOUNTER — Encounter (HOSPITAL_COMMUNITY): Payer: Self-pay | Admitting: Orthopaedic Surgery

## 2024-02-02 ENCOUNTER — Other Ambulatory Visit: Payer: Self-pay

## 2024-02-02 NOTE — Progress Notes (Signed)
 SDW call  Patient was given pre-op instructions over the phone. Patient verbalized understanding of instructions provided. [employer] interpreter, patient declined     PCP - denies Cardiologist -na  Pulmonary: na   PPM/ICD - denies Device Orders - na Rep Notified - na   Chest x-ray - na EKG -  na Stress Test - ECHO -  Cardiac Cath -   Sleep Study/sleep apnea/CPAP: denies  Non-diabetic  Blood Thinner Instructions: denies Aspirin Instructions:denies   ERAS Protcol -  Clears until 0800   Anesthesia review: No   Patient denies shortness of breath, fever, cough and chest pain over the phone call  Your procedure is scheduled on Thursday February 03, 2024   Report to [facility] Main Entrance A at  0830  A.M., then check in with the Admitting office.  Call this number if you have problems the morning of surgery:  [phone]   If you have any questions prior to your surgery date call [phone]: Open Monday-Friday 8am-4pm If you experience any cold or flu symptoms such as cough, fever, chills, shortness of breath, etc. between now and your scheduled surgery, please notify us  at the above number    Remember:  Do not eat after midnight the night before your surgery  You may drink clear liquids until 0800    the morning of your surgery.   Clear liquids allowed are: Water, Non-Citrus Juices (without pulp), Carbonated Beverages, Clear Tea, Black Coffee ONLY (NO MILK, CREAM OR POWDERED CREAMER of any kind), and Gatorade   Take these medicines the morning of surgery with A SIP OF WATER:  Augmentin, cephalexin   As needed: Oxycodone   As of today, STOP taking any Aspirin (unless otherwise instructed by your surgeon) Aleve, Naproxen, Ibuprofen, Motrin, Advil, Goody's, BC's, all herbal medications, fish oil, and all vitamins.

## 2024-02-03 ENCOUNTER — Ambulatory Visit (HOSPITAL_COMMUNITY): Payer: Self-pay

## 2024-02-03 ENCOUNTER — Ambulatory Visit (HOSPITAL_BASED_OUTPATIENT_CLINIC_OR_DEPARTMENT_OTHER): Payer: Self-pay | Admitting: Anesthesiology

## 2024-02-03 ENCOUNTER — Ambulatory Visit (HOSPITAL_COMMUNITY): Payer: Self-pay | Admitting: Anesthesiology

## 2024-02-03 ENCOUNTER — Ambulatory Visit (HOSPITAL_COMMUNITY)
Admission: RE | Admit: 2024-02-03 | Discharge: 2024-02-03 | Disposition: A | Discharge status: Home / Self Care | Payer: Self-pay | Attending: Orthopaedic Surgery | Admitting: Orthopaedic Surgery

## 2024-02-03 ENCOUNTER — Other Ambulatory Visit: Payer: Self-pay

## 2024-02-03 ENCOUNTER — Encounter (HOSPITAL_COMMUNITY)
Admission: RE | Disposition: A | Discharge status: Home / Self Care | Payer: Self-pay | Source: Home / Self Care | Attending: Orthopaedic Surgery

## 2024-02-03 ENCOUNTER — Other Ambulatory Visit (HOSPITAL_COMMUNITY): Payer: Self-pay

## 2024-02-03 ENCOUNTER — Encounter (HOSPITAL_COMMUNITY): Payer: Self-pay | Admitting: Orthopaedic Surgery

## 2024-02-03 DIAGNOSIS — W3189XD Contact with other specified machinery, subsequent encounter: Secondary | ICD-10-CM | POA: Insufficient documentation

## 2024-02-03 DIAGNOSIS — S98141D Partial traumatic amputation of one right lesser toe, subsequent encounter: Secondary | ICD-10-CM | POA: Insufficient documentation

## 2024-02-03 DIAGNOSIS — Z79899 Other long term (current) drug therapy: Secondary | ICD-10-CM | POA: Insufficient documentation

## 2024-02-03 DIAGNOSIS — I96 Gangrene, not elsewhere classified: Secondary | ICD-10-CM | POA: Insufficient documentation

## 2024-02-03 DIAGNOSIS — S98921D Partial traumatic amputation of right foot, level unspecified, subsequent encounter: Secondary | ICD-10-CM

## 2024-02-03 DIAGNOSIS — S98121D Partial traumatic amputation of right great toe, subsequent encounter: Secondary | ICD-10-CM | POA: Insufficient documentation

## 2024-02-03 HISTORY — PX: AMPUTATION TOE: SHX6595

## 2024-02-03 HISTORY — DX: Other specified health status: Z78.9

## 2024-02-03 SURGERY — AMPUTATION, TOE
Anesthesia: Regional | Site: Toe | Laterality: Right

## 2024-02-03 MED ORDER — PHENYLEPHRINE 80 MCG/ML (10ML) SYRINGE FOR IV PUSH (FOR BLOOD PRESSURE SUPPORT)
PREFILLED_SYRINGE | INTRAVENOUS | Status: DC | PRN
  Administered 2024-02-03 (×3): 80 ug via INTRAVENOUS

## 2024-02-03 MED ORDER — FENTANYL CITRATE (PF) 100 MCG/2ML IJ SOLN: 25.0000 ug | INTRAMUSCULAR | Status: DC | PRN

## 2024-02-03 MED ORDER — ONDANSETRON HCL 4 MG/2ML IJ SOLN
INTRAMUSCULAR | Status: AC
  Filled 2024-02-03: qty 2

## 2024-02-03 MED ORDER — VANCOMYCIN HCL 1000 MG IV SOLR
INTRAVENOUS | Status: DC | PRN
Start: 2024-02-03 — End: 2024-02-03
  Administered 2024-02-03: 1000 mg

## 2024-02-03 MED ORDER — AMISULPRIDE (ANTIEMETIC) 5 MG/2ML IV SOLN: 10.0000 mg | Freq: Once | INTRAVENOUS | Status: DC | PRN

## 2024-02-03 MED ORDER — FENTANYL CITRATE (PF) 100 MCG/2ML IJ SOLN
INTRAMUSCULAR | Status: DC | PRN
  Administered 2024-02-03: 50 ug via INTRAVENOUS

## 2024-02-03 MED ORDER — DEXAMETHASONE SODIUM PHOSPHATE 10 MG/ML IJ SOLN
INTRAMUSCULAR | Status: DC | PRN
  Administered 2024-02-03: 10 mg via INTRAVENOUS

## 2024-02-03 MED ORDER — PHENYLEPHRINE 80 MCG/ML (10ML) SYRINGE FOR IV PUSH (FOR BLOOD PRESSURE SUPPORT)
PREFILLED_SYRINGE | INTRAVENOUS | Status: AC
  Filled 2024-02-03: qty 10

## 2024-02-03 MED ORDER — FENTANYL CITRATE (PF) 100 MCG/2ML IJ SOLN
INTRAMUSCULAR | Status: AC
  Administered 2024-02-03: 50 ug via INTRAVENOUS
  Filled 2024-02-03: qty 2

## 2024-02-03 MED ORDER — LIDOCAINE 2% (20 MG/ML) 5 ML SYRINGE
INTRAMUSCULAR | Status: DC | PRN
  Administered 2024-02-03: 60 mg via INTRAVENOUS

## 2024-02-03 MED ORDER — DEXAMETHASONE SODIUM PHOSPHATE 10 MG/ML IJ SOLN
INTRAMUSCULAR | Status: AC
  Filled 2024-02-03: qty 1

## 2024-02-03 MED ORDER — ONDANSETRON HCL 4 MG/2ML IJ SOLN
INTRAMUSCULAR | Status: DC | PRN
  Administered 2024-02-03: 4 mg via INTRAVENOUS

## 2024-02-03 MED ORDER — VANCOMYCIN HCL 1000 MG IV SOLR
INTRAVENOUS | Status: AC
  Filled 2024-02-03: qty 20

## 2024-02-03 MED ORDER — ORAL CARE MOUTH RINSE: 15.0000 mL | Freq: Once | OROMUCOSAL | Status: AC

## 2024-02-03 MED ORDER — FENTANYL CITRATE (PF) 100 MCG/2ML IJ SOLN: 50.0000 ug | Freq: Once | INTRAMUSCULAR | Status: AC

## 2024-02-03 MED ORDER — PROPOFOL 10 MG/ML IV BOLUS
INTRAVENOUS | Status: DC | PRN
  Administered 2024-02-03: 75 ug/kg/min via INTRAVENOUS
  Administered 2024-02-03: 50 mg via INTRAVENOUS

## 2024-02-03 MED ORDER — ONDANSETRON HCL 4 MG/2ML IJ SOLN: 4.0000 mg | Freq: Once | INTRAMUSCULAR | Status: DC | PRN

## 2024-02-03 MED ORDER — ROPIVACAINE HCL 5 MG/ML IJ SOLN
INTRAMUSCULAR | Status: DC | PRN
Start: 2024-02-03 — End: 2024-02-03
  Administered 2024-02-03: 10 mL via PERINEURAL
  Administered 2024-02-03: 30 mL via PERINEURAL

## 2024-02-03 MED ORDER — 0.9 % SODIUM CHLORIDE (POUR BTL) OPTIME
TOPICAL | Status: DC | PRN
  Administered 2024-02-03: 1000 mL

## 2024-02-03 MED ORDER — LACTATED RINGERS IV SOLN: INTRAVENOUS | Status: DC

## 2024-02-03 MED ORDER — MIDAZOLAM HCL 2 MG/2ML IJ SOLN: 2.0000 mg | Freq: Once | INTRAMUSCULAR | Status: AC

## 2024-02-03 MED ORDER — CEFAZOLIN SODIUM-DEXTROSE 2-4 GM/100ML-% IV SOLN
2.0000 g | INTRAVENOUS | Status: AC
  Administered 2024-02-03: 2 g via INTRAVENOUS
  Filled 2024-02-03: qty 100

## 2024-02-03 MED ORDER — CHLORHEXIDINE GLUCONATE 0.12 % MT SOLN
15.0000 mL | Freq: Once | OROMUCOSAL | Status: AC
  Administered 2024-02-03: 15 mL via OROMUCOSAL
  Filled 2024-02-03: qty 15

## 2024-02-03 MED ORDER — LIDOCAINE 2% (20 MG/ML) 5 ML SYRINGE
INTRAMUSCULAR | Status: AC
Start: 2024-02-03 — End: 2024-02-03
  Filled 2024-02-03: qty 5

## 2024-02-03 MED ORDER — POVIDONE-IODINE 7.5 % EX SOLN
Freq: Once | CUTANEOUS | Status: DC
  Filled 2024-02-03: qty 118

## 2024-02-03 MED ORDER — ACETAMINOPHEN 500 MG PO TABS
1000.0000 mg | ORAL_TABLET | Freq: Once | ORAL | Status: AC
  Administered 2024-02-03: 1000 mg via ORAL
  Filled 2024-02-03: qty 2

## 2024-02-03 MED ORDER — MIDAZOLAM HCL 2 MG/2ML IJ SOLN
INTRAMUSCULAR | Status: AC
  Administered 2024-02-03: 2 mg via INTRAVENOUS
  Filled 2024-02-03: qty 2

## 2024-02-03 MED ORDER — OXYCODONE HCL 5 MG PO TABS
5.0000 mg | ORAL_TABLET | ORAL | 0 refills | Status: AC | PRN
  Filled 2024-02-03: qty 30, 3d supply, fill #0

## 2024-02-03 MED ORDER — FENTANYL CITRATE (PF) 250 MCG/5ML IJ SOLN
INTRAMUSCULAR | Status: AC
  Filled 2024-02-03: qty 5

## 2024-02-03 SURGICAL SUPPLY — 39 items
BAG COUNTER SPONGE SURGICOUNT (BAG) IMPLANT
BLADE SAW SGTL NAR THIN XSHT (BLADE) ×2 IMPLANT
BNDG COMPR ESMARK 4X3 LF (GAUZE/BANDAGES/DRESSINGS) IMPLANT
BNDG ELASTIC 4INX 5YD STR LF (GAUZE/BANDAGES/DRESSINGS) ×4 IMPLANT
BNDG ELASTIC 4X5.8 VLCR STR LF (GAUZE/BANDAGES/DRESSINGS) ×4 IMPLANT
BNDG ELASTIC 6X10 VLCR STRL LF (GAUZE/BANDAGES/DRESSINGS) ×4 IMPLANT
BNDG GAUZE DERMACEA FLUFF 4 (GAUZE/BANDAGES/DRESSINGS) ×4 IMPLANT
COVER SURGICAL LIGHT HANDLE (MISCELLANEOUS) ×8 IMPLANT
DRAPE INCISE IOBAN 66X45 STRL (DRAPES) ×2 IMPLANT
DRAPE U-SHAPE 47X51 STRL (DRAPES) ×4 IMPLANT
DRESSING PEEL AND PLAC PRVNA20 (GAUZE/BANDAGES/DRESSINGS) ×2 IMPLANT
DRSG XEROFORM 1X8 (GAUZE/BANDAGES/DRESSINGS) IMPLANT
DURAPREP 26ML APPLICATOR (WOUND CARE) ×4 IMPLANT
ELECTRODE REM PT RTRN 9FT ADLT (ELECTROSURGICAL) ×4 IMPLANT
GAUZE SPONGE 4X4 12PLY STRL (GAUZE/BANDAGES/DRESSINGS) ×2 IMPLANT
GAUZE XEROFORM 5X9 LF (GAUZE/BANDAGES/DRESSINGS) ×2 IMPLANT
GLOVE BIOGEL M STRL SZ7.5 (GLOVE) ×4 IMPLANT
GLOVE INDICATOR 8.0 STRL GRN (GLOVE) ×4 IMPLANT
GLOVE SRG 8 PF TXTR STRL LF DI (GLOVE) ×4 IMPLANT
GOWN STRL REUS W/ TWL LRG LVL3 (GOWN DISPOSABLE) ×4 IMPLANT
GOWN STRL REUS W/ TWL XL LVL3 (GOWN DISPOSABLE) ×4 IMPLANT
KIT BASIN OR (CUSTOM PROCEDURE TRAY) ×4 IMPLANT
KIT DRSG PREVENA PLUS 7DAY 125 (MISCELLANEOUS) ×2 IMPLANT
KIT TURNOVER KIT B (KITS) ×4 IMPLANT
MANIFOLD NEPTUNE II (INSTRUMENTS) ×4 IMPLANT
NDL 22X1.5 STRL (OR ONLY) (MISCELLANEOUS) IMPLANT
NEEDLE 22X1.5 STRL (OR ONLY) (MISCELLANEOUS) IMPLANT
NS IRRIG 1000ML POUR BTL (IV SOLUTION) ×4 IMPLANT
PACK ORTHO EXTREMITY (CUSTOM PROCEDURE TRAY) ×4 IMPLANT
PAD ARMBOARD POSITIONER FOAM (MISCELLANEOUS) ×8 IMPLANT
PAD CAST 4YDX4 CTTN HI CHSV (CAST SUPPLIES) ×4 IMPLANT
PADDING CAST ABS COTTON 4X4 ST (CAST SUPPLIES) ×4 IMPLANT
SET CYSTO IRRIGATION (SET/KITS/TRAYS/PACK) ×2 IMPLANT
SUT ETHILON 2 0 FS 18 (SUTURE) ×4 IMPLANT
SUT ETHILON 2 0 PSLX (SUTURE) ×2 IMPLANT
SYR CONTROL 10ML LL (SYRINGE) IMPLANT
TOWEL GREEN STERILE (TOWEL DISPOSABLE) ×4 IMPLANT
TUBE CONNECTING 20X1/4 (TUBING) ×4 IMPLANT
YANKAUER SUCT BULB TIP NO VENT (SUCTIONS) ×4 IMPLANT

## 2024-02-03 NOTE — Op Note (Signed)
 Eugene Marquez male 58 y.o. 02/03/2024  PreOperative Diagnosis: Right foot traumatic partial amputation  PostOperative Diagnosis: Same  PROCEDURE: Right partial first ray resection Right partial second ray resection Local soft tissue rearrangement  SURGEON: Lonni Pae, MD  ASSISTANT: Jesse [place], PA-C was necessary for patient positioning, prep, drape assistance with surgery and closure as well as placement of dressing  ANESTHESIA: MAC  FINDINGS: See below  IMPLANTS: None  INDICATIONS:58 y.o. male underwent an injury involving a lawnmower to his right foot.  Initially he was seen in the emergency department and taken for a washout and closure by on-call orthopedics.  He was seen in the office and there was concern for dysvascular 1st and 2nd toes with significant amount of skin compromise and developing wet gangrene.  Given these findings he was indicated for revision surgery, exploration and likely 2nd and 1st toe amputations as needed.   Patient understood the risks, benefits and alternatives to surgery which include but are not limited to wound healing complications, infection, nonunion, malunion, need for further surgery as well as damage to surrounding structures. They also understood the potential for continued pain in that there were no guarantees of acceptable outcome After weighing these risks the patient opted to proceed with surgery.  PROCEDURE: Patient was identified in the preoperative holding area.  The right foot was marked by myself.  Consent was signed by myself and the patient.   Patient was taken to the operative suite and placed supine on the operative table.  MAC anesthesia was induced without difficulty. Bump was placed under the operative hip and bone foam was used.  All bony prominences were well padded.   Preoperative antibiotics were given. The extremity was prepped and draped in the usual sterile fashion and surgical timeout was performed.    1st  and 2nd partial ray resections: We began by inspecting the foot there was significant duskiness and onset of gangrene to the hallux and the second toe.  There was skin compromise throughout this area with signs of infection.  Appropriate skin incision was then marked out incorporating any of the viable tissue that remained.  Then 10 blade was used to create the incision that ellipsed out the great toe near the level of the first MTP joint and the second toe a bit more proximally.  Then sharp dissection was used to remove the hallux and the second toe en bloc including the dysvascular tissue and injured tissue and infection.  This was then discarded.  The remaining soft tissue was then inspected and it was found that there was not enough soft tissue for primary closure and therefore decision was made to convert to partial ray resections.  A sagittal saw was then used to remove the distal third portion of the first metatarsal and second metatarsal removing the necks and heads of those bones completing the 1st and 2nd ray resection.  Then the tissue was further inspected and there was some areas that were closable however local soft tissue rearrangement needed to be performed in order to perform adequate closure overlying the traumatic site.  The wounds were then irrigated copiously with normal saline and approximately 3000 cc of normal saline was used as irrigant.  Then further inspection was carried out and any devitalized tissue was removed from the area of injury.  Traction tenotomy's were performed.  Sesamoids were left in place.  Then attempt was made to assess closure and it was noted that soft tissue rearrangement in the form of the  plasty was necessary.  Local soft tissue rearrangement, Z-plasty:  On the more lateral portion of the wound separate incision was made performing a Z-plasty in the area to allow for closure.  This was done just medial and plantar to the third toe which remained viable.   After Z-plasty was performed the plantar soft tissue was translated medially to allow for tension-free closure about the area of traumatic wound.  Vancomycin  powder was placed within the wound prior to closure and then the wound was then closed carefully using 2-0 nylon suture.  Then the foot was then cleaned.  Attempt was made to place a wound VAC however given the location we were unable to obtain acceptable seal and therefore placement of wound VAC was aborted and soft dressing was placed.  Xeroform, 4 x 4's, sterile sheet cotton and Ace wrap were used.  He will be given a postoperative shoe.  POST OPERATIVE INSTRUCTIONS: Nonweightbearing to operative extremity Keep dressing in place until follow-up in 1 week He will be discharged on antibiotics   TOURNIQUET TIME: No tourniquet  BLOOD LOSS:  less than 100 mL         DRAINS: none         SPECIMEN: none       COMPLICATIONS:  * No complications entered in OR log *         Disposition: PACU - hemodynamically stable.         Condition: stable

## 2024-02-03 NOTE — Discharge Instructions (Signed)
 DR. [facility]SA [facility] POST-OP INSTRUCTIONS   Pain Management The numbing medicine and your leg will last around 18 hours, take a dose of your pain medicine as soon as you feel it wearing off to avoid rebound pain. Keep your foot elevated above heart level.  Make sure that your heel hangs free ('floats'). Take all prescribed medication as directed. If taking narcotic pain medication you may want to use an over-the-counter stool softener to avoid constipation. You may take over-the-counter NSAIDs (ibuprofen, naproxen, etc.) as well as over-the-counter acetaminophen  as directed on the packaging as a supplement for your pain and may also use it to wean away from the prescription medication.  Activity Non-weightbearing in post op shoe  Keep dressing intact  First Postoperative Visit Your first postop visit will be at least 2 weeks after surgery.  This should be scheduled when you schedule surgery. If you do not have a postoperative visit scheduled please call [phone] to schedule an appointment. At the appointment your incision will be evaluated for suture removal, x-rays will be obtained if necessary.  General Instructions Swelling is very common after foot and ankle surgery.  It often takes 3 months for the foot and ankle to begin to feel comfortable.  Some amount of swelling will persist for 6-12 months. DO NOT change the dressing.  If there is a problem with the dressing (too tight, loose, gets wet, etc.) please contact Dr. Isiah office. DO NOT get the dressing wet.  For showers you can use an over-the-counter cast cover or wrap a washcloth around the top of your dressing and then cover it with a plastic bag and tape it to your leg. DO NOT soak the incision (no tubs, pools, bath, etc.) until you have approval from Dr. ELSA.  Contact Dr. Nadara office or go to Emergency Room if: Temperature above 101 F. Increasing pain that is unresponsive to pain medication or  elevation Excessive redness or swelling in your foot Dressing problems - excessive bloody drainage, looseness or tightness, or if dressing gets wet Develop pain, swelling, warmth, or discoloration of your calf

## 2024-02-03 NOTE — Anesthesia Procedure Notes (Signed)
 Anesthesia Regional Block: Adductor canal block   Pre-Anesthetic Checklist: , timeout performed,  Correct Patient, Correct Site, Correct Laterality,  Correct Procedure, Correct Position, site marked,  Risks and benefits discussed,  Surgical consent,  Pre-op evaluation,  At surgeon's request and post-op pain management  Laterality: Right  Prep: chloraprep       Needles:  Injection technique: Single-shot  Needle Type: Echogenic Needle     Needle Length: 9cm  Needle Gauge: 21     Additional Needles:   Procedures:,,,, ultrasound used (permanent image in chart),,    Narrative:  Start time: 02/03/2024 9:55 AM End time: 02/03/2024 10:00 AM Injection made incrementally with aspirations every 5 mL.  Performed by: Personally  Anesthesiologist: Corinne Garnette BRAVO, MD  Additional Notes: No pain on injection. No increased resistance to injection. Injection made in 5cc increments.  Good needle visualization.  Patient tolerated procedure well.

## 2024-02-03 NOTE — Progress Notes (Signed)
 Orthopedic Tech Progress Note Patient Details:  Eugene Marquez [birth year] [id number]  Ortho Devices Type of Ortho Device: Postop shoe/boot Ortho Device/Splint Location: RLE Ortho Device/Splint Interventions: Application, Ordered   Post Interventions Patient Tolerated: Well  Eugene Marquez DELENA Cos 02/03/2024, 12:57 PM

## 2024-02-03 NOTE — H&P (Signed)
 PREOPERATIVE H&P  Chief Complaint: Right foot traumatic lawnmower injury  HPI: Eugene Marquez is a 58 y.o. male who presents for preoperative history and physical with a diagnosis of right foot traumatic wound.  He was was injured with a lawnmower approximately 1 week ago and went to the OR for irrigation and closure of his wounds.  Unfortunately in close follow-up patient was noted to have some necrosis of his hallux and second toe and there was concern for developing infection.  He he is here today for inspection, exploration and likely amputation of hallux and second toes. Symptoms are rated as moderate to severe, and have been worsening.  This is significantly impairing activities of daily living.  He has elected for surgical management.   History reviewed. No pertinent past medical history. Past Surgical History:  Procedure Laterality Date   APPENDECTOMY     INCISION AND DRAINAGE OF WOUND Right 01/23/2024   Procedure: IRRIGATION AND DEBRIDEMENT; WOUND CLOSURE;  Surgeon: Dozier Soulier, MD;  Location: [facility] OR;  Service: Orthopedics;  Laterality: Right;   Social History   Socioeconomic History   Marital status: Single    Spouse name: Not on file   Number of children: Not on file   Years of education: Not on file   Highest education level: Not on file  Occupational History   Not on file  Tobacco Use   Smoking status: Never   Smokeless tobacco: Never  Vaping Use   Vaping status: Never Used  Substance and Sexual Activity   Alcohol use: Not Currently    Alcohol/week: 10.0 standard drinks of alcohol    Types: 10 Cans of beer per week   Drug use: Never   Sexual activity: Not on file  Other Topics Concern   Not on file  Social History Narrative   Not on file   Social Drivers of Health   Financial Resource Strain: Not on file  Food Insecurity: Not on file  Transportation Needs: Not on file  Physical Activity: Not on file  Stress: Not on file  Social Connections: Not on file    History reviewed. No pertinent family history. No Known Allergies Prior to Admission medications   Medication Sig Start Date End Date Taking? Authorizing Provider  amoxicillin-clavulanate (AUGMENTIN) 875-125 MG tablet Take 1 tablet by mouth 2 (two) times daily. 01/26/24   [provider]  cephALEXin  (KEFLEX ) 500 MG capsule Take 1 capsule (500 mg total) by mouth 4 (four) times daily. 01/23/24   Porterfield, Amber, PA-C  gabapentin (NEURONTIN) 300 MG capsule Take 300 mg by mouth at bedtime. 01/31/24   [provider]  oxyCODONE  (ROXICODONE ) 5 MG immediate release tablet Take 1 tablet (5 mg total) by mouth every 4 (four) hours as needed for severe pain (pain score 7-10). 01/23/24   Porterfield, Hospital doctor, PA-C     Positive ROS: All other systems have been reviewed and were otherwise negative with the exception of those mentioned in the HPI and as above.  Physical Exam:  There were no vitals filed for this visit. General: Alert, no acute distress Cardiovascular: No pedal edema Respiratory: No cyanosis, no use of accessory musculature GI: No organomegaly, abdomen is soft and non-tender Skin: No lesions in the area of chief complaint Neurologic: Sensation intact distally Psychiatric: Patient is competent for consent with normal mood and affect Lymphatic: No axillary or cervical lymphadenopathy  MUSCULOSKELETAL: Right foot demonstrates evidence of lawnmower injury with sutures in place.  There are some drainage.  There is significant  swelling of the hallux and second toe with some necrosis of the second toe and hallux.  Foul odor present.  Assessment: Right foot traumatic lawnmower injury with dysvascular hallux and second toe.  We may place a Prevena wound VAC on the incision.   Plan: Plan for Patient will require revision surgery and washout including likely hallux and second toe amputation.  We did discuss the possibility of needing further surgery if there is further skin  breakdown but given his risk of infection and currently necrotic tissue he is indicated for surgery now.  He voiced understanding.  He wishes to proceed with surgery..  We discussed the risks, benefits and alternatives of surgery which include but are not limited to wound healing complications, infection, nonunion, malunion, need for further surgery, damage to surrounding structures and continued pain.  They understand there is no guarantees to an acceptable outcome.  After weighing these risks they opted to proceed with surgery.     Eugene JONELLE Pae, MD    02/03/2024 8:34 AM

## 2024-02-03 NOTE — Anesthesia Preprocedure Evaluation (Signed)
 Anesthesia Evaluation  Patient identified by MRN, date of birth, ID band Patient awake    Reviewed: Allergy & Precautions, NPO status , Patient's Chart, lab work & pertinent test results  Airway Mallampati: II  TM Distance: >3 FB Neck ROM: Full    Dental  (+) Teeth Intact, Dental Advisory Given   Pulmonary neg pulmonary ROS   Pulmonary exam normal breath sounds clear to auscultation       Cardiovascular negative cardio ROS Normal cardiovascular exam Rhythm:Regular Rate:Normal     Neuro/Psych negative neurological ROS  negative psych ROS   GI/Hepatic negative GI ROS, Neg liver ROS,,,  Endo/Other  negative endocrine ROS    Renal/GU negative Renal ROS     Musculoskeletal RIGHT FOREFOOT GANGRENE   Abdominal   Peds  Hematology negative hematology ROS (+)   Anesthesia Other Findings Day of surgery medications reviewed with the patient.  Reproductive/Obstetrics                              Anesthesia Physical Anesthesia Plan  ASA: 3  Anesthesia Plan: Regional   Post-op Pain Management: Tylenol  PO (pre-op)*   Induction: Intravenous  PONV Risk Score and Plan: 1 and Midazolam , TIVA, Dexamethasone  and Ondansetron   Airway Management Planned: Natural Airway and Simple Face Mask  Additional Equipment:   Intra-op Plan:   Post-operative Plan:   Informed Consent: I have reviewed the patients History and Physical, chart, labs and discussed the procedure including the risks, benefits and alternatives for the proposed anesthesia with the patient or authorized representative who has indicated his/her understanding and acceptance.     Dental advisory given  Plan Discussed with: CRNA  Anesthesia Plan Comments:         Anesthesia Quick Evaluation

## 2024-02-03 NOTE — Transfer of Care (Signed)
 Immediate Anesthesia Transfer of Care Note  Patient: Eugene Marquez  Procedure(s) Performed: RIGHT FIRST AND SECOND RAY RESECTION WITH WOUND VAC APPLICATION (Right: Toe)  Patient Location: PACU  Anesthesia Type:MAC  Level of Consciousness: awake, alert , and oriented  Airway & Oxygen Therapy: Patient Spontanous Breathing and Patient connected to face mask oxygen  Post-op Assessment: Report given to RN and Post -op Vital signs reviewed and stable  Post vital signs: Reviewed and stable  Last Vitals:  Vitals Value Taken Time  BP 99/64 02/03/24 12:10  Temp 36.5 C 02/03/24 12:10  Pulse 59 02/03/24 12:14  Resp 16 02/03/24 12:14  SpO2 96 % 02/03/24 12:14  Vitals shown include unfiled device data.  Last Pain:  Vitals:   02/03/24 1210  PainSc: 0-No pain         Complications: No notable events documented.

## 2024-02-03 NOTE — Anesthesia Postprocedure Evaluation (Signed)
 Anesthesia Post Note  Patient: Eugene Marquez  Procedure(s) Performed: RIGHT FIRST AND SECOND RAY RESECTION WITH WOUND VAC APPLICATION (Right: Toe)     Patient location during evaluation: PACU Anesthesia Type: Regional Level of consciousness: awake and alert Pain management: pain level controlled Vital Signs Assessment: post-procedure vital signs reviewed and stable Respiratory status: spontaneous breathing, nonlabored ventilation and respiratory function stable Cardiovascular status: stable and blood pressure returned to baseline Postop Assessment: no apparent nausea or vomiting Anesthetic complications: no   No notable events documented.  Last Vitals:  Vitals:   02/03/24 1230 02/03/24 1245  BP: 102/78 116/86  Pulse: (!) 59 (!) 59  Resp: 17 15  Temp:  36.5 C  SpO2: 93% 97%    Last Pain:  Vitals:   02/03/24 1210  PainSc: 0-No pain                 Garnette FORBES Skillern

## 2024-02-03 NOTE — Anesthesia Procedure Notes (Signed)
 Anesthesia Regional Block: Popliteal block   Pre-Anesthetic Checklist: , timeout performed,  Correct Patient, Correct Site, Correct Laterality,  Correct Procedure, Correct Position, site marked,  Risks and benefits discussed,  Surgical consent,  Pre-op evaluation,  At surgeon's request and post-op pain management  Laterality: Right  Prep: chloraprep       Needles:  Injection technique: Single-shot  Needle Type: Echogenic Needle     Needle Length: 9cm  Needle Gauge: 21     Additional Needles:   Procedures:,,,, ultrasound used (permanent image in chart),,    Narrative:  Start time: 02/03/2024 9:50 AM End time: 02/03/2024 9:55 AM Injection made incrementally with aspirations every 5 mL.  Performed by: Personally  Anesthesiologist: Corinne Garnette BRAVO, MD  Additional Notes: No pain on injection. No increased resistance to injection. Injection made in 5cc increments.  Good needle visualization.  Patient tolerated procedure well.

## 2024-02-04 ENCOUNTER — Encounter (HOSPITAL_COMMUNITY): Payer: Self-pay | Admitting: Orthopaedic Surgery

## 2024-02-26 ENCOUNTER — Other Ambulatory Visit (HOSPITAL_COMMUNITY): Payer: Self-pay

## 2024-02-28 ENCOUNTER — Other Ambulatory Visit (HOSPITAL_COMMUNITY): Payer: Self-pay
